# Patient Record
Sex: Male | Born: 1960 | Race: White | Hispanic: No | Marital: Married | Smoking: Never smoker
Health system: Southern US, Community
[De-identification: ages and names within clinical notes are randomized; demographics above are authoritative.]

## PROBLEM LIST (undated history)

## (undated) DIAGNOSIS — R972 Elevated prostate specific antigen [PSA]: Secondary | ICD-10-CM

## (undated) DIAGNOSIS — T7840XA Allergy, unspecified, initial encounter: Secondary | ICD-10-CM

## (undated) DIAGNOSIS — K219 Gastro-esophageal reflux disease without esophagitis: Secondary | ICD-10-CM

## (undated) DIAGNOSIS — I10 Essential (primary) hypertension: Secondary | ICD-10-CM

## (undated) HISTORY — DX: Allergy, unspecified, initial encounter: T78.40XA

## (undated) HISTORY — DX: Gastro-esophageal reflux disease without esophagitis: K21.9

## (undated) HISTORY — PX: CHOLECYSTECTOMY: SHX55

## (undated) HISTORY — DX: Essential (primary) hypertension: I10

## (undated) HISTORY — PX: TONSILLECTOMY: SUR1361

## (undated) HISTORY — DX: Elevated prostate specific antigen (PSA): R97.20

---

## 2013-02-06 ENCOUNTER — Ambulatory Visit: Payer: Self-pay | Admitting: General Practice

## 2014-02-03 ENCOUNTER — Ambulatory Visit: Payer: Self-pay | Admitting: General Practice

## 2014-04-03 HISTORY — PX: NECK SURGERY: SHX720

## 2014-05-19 DIAGNOSIS — M4802 Spinal stenosis, cervical region: Secondary | ICD-10-CM | POA: Insufficient documentation

## 2015-02-08 ENCOUNTER — Other Ambulatory Visit: Payer: Self-pay | Admitting: Physician Assistant

## 2015-02-11 ENCOUNTER — Other Ambulatory Visit: Payer: Self-pay | Admitting: Emergency Medicine

## 2015-02-11 DIAGNOSIS — I10 Essential (primary) hypertension: Secondary | ICD-10-CM

## 2015-02-11 MED ORDER — LISINOPRIL 20 MG PO TABS: 20.0000 mg | ORAL_TABLET | Freq: Every day | ORAL | Status: DC

## 2015-02-11 NOTE — Telephone Encounter (Signed)
Received a faxed medication request from [facility] in [place].  Please advise.  Thank you. 

## 2015-02-11 NOTE — Telephone Encounter (Signed)
Med refill approved 

## 2015-03-17 ENCOUNTER — Encounter: Payer: Self-pay | Admitting: Physician Assistant

## 2015-03-17 ENCOUNTER — Ambulatory Visit: Payer: Self-pay | Admitting: Physician Assistant

## 2015-03-17 VITALS — BP 140/90 | HR 96 | Temp 97.9°F

## 2015-03-17 DIAGNOSIS — J209 Acute bronchitis, unspecified: Secondary | ICD-10-CM

## 2015-03-17 MED ORDER — METHYLPREDNISOLONE 4 MG PO TBPK: ORAL_TABLET | ORAL | Status: DC

## 2015-03-17 MED ORDER — ALBUTEROL SULFATE HFA 108 (90 BASE) MCG/ACT IN AERS: 2.0000 | INHALATION_SPRAY | Freq: Four times a day (QID) | RESPIRATORY_TRACT | Status: DC | PRN

## 2015-03-17 MED ORDER — IPRATROPIUM-ALBUTEROL 0.5-2.5 (3) MG/3ML IN SOLN
3.0000 mL | Freq: Once | RESPIRATORY_TRACT | Status: AC
  Administered 2015-03-17: 3 mL via RESPIRATORY_TRACT

## 2015-03-17 MED ORDER — AZITHROMYCIN 250 MG PO TABS: ORAL_TABLET | ORAL | Status: DC

## 2015-03-17 NOTE — Progress Notes (Signed)
S: C/o cough and congestion with wheezing and chest tightness, chest is sore from coughing, denies fever, chills, mucus is green,  cough is dry and hacking; keeping pt awake at night;  Also has sneezing and a runny nose; denies cardiac type chest pain or sob, v/d, abd pain Remainder ros neg  O: vitals wnl, nad, tms clear, throat injected, neck supple no lymph, lungs with wheezing, cv rrr, neuro intact, svn duoneb given, wheezing decreased pt states he feels much better  A:  Acute bronchitis   P:  rx medication:  Zpack , medrol dose pack, albuterol inhaler;  use otc meds, tylenol or motrin as needed for fever/chills, return if not better in 3 -5 days, return earlier if worsening

## 2015-04-12 ENCOUNTER — Other Ambulatory Visit: Payer: Self-pay | Admitting: Physician Assistant

## 2015-04-14 MED ORDER — IPRATROPIUM-ALBUTEROL 0.5-2.5 (3) MG/3ML IN SOLN
3.0000 mL | Freq: Four times a day (QID) | RESPIRATORY_TRACT | Status: DC
  Administered 2015-04-14: 3 mL via RESPIRATORY_TRACT

## 2015-04-14 NOTE — Addendum Note (Signed)
Addended by: Catha BrowEACON, Tayshaun Kroh T on: 04/14/2015 02:03 PM   Modules accepted: Orders

## 2015-04-15 ENCOUNTER — Telehealth: Payer: Self-pay | Admitting: Emergency Medicine

## 2015-04-15 NOTE — Telephone Encounter (Signed)
Patient's wife called and expressed that her husband has finished the Zpack and continues to cough up green mucus.  Per Susan's authorization I [facility] [place] [facility] [place]

## 2015-04-20 ENCOUNTER — Other Ambulatory Visit: Payer: Self-pay | Admitting: Emergency Medicine

## 2015-04-20 DIAGNOSIS — J209 Acute bronchitis, unspecified: Secondary | ICD-10-CM

## 2015-04-20 NOTE — Telephone Encounter (Signed)
Received a faxed medication request from [facility] [place].  Please advise.  Thank you. 

## 2015-04-21 MED ORDER — HYDROCHLOROTHIAZIDE 25 MG PO TABS: 25.0000 mg | ORAL_TABLET | Freq: Every day | ORAL | Status: DC

## 2015-04-21 NOTE — Telephone Encounter (Signed)
Med refill approved 

## 2015-10-13 ENCOUNTER — Other Ambulatory Visit: Payer: Self-pay | Admitting: Physician Assistant

## 2015-10-13 NOTE — Telephone Encounter (Signed)
Needs fasting labs in clinic  .

## 2015-11-09 ENCOUNTER — Other Ambulatory Visit: Payer: Self-pay | Admitting: Physician Assistant

## 2015-11-09 DIAGNOSIS — I10 Essential (primary) hypertension: Secondary | ICD-10-CM

## 2015-12-02 ENCOUNTER — Other Ambulatory Visit: Payer: Self-pay | Admitting: Emergency Medicine

## 2015-12-02 MED ORDER — HYDROCHLOROTHIAZIDE 25 MG PO TABS: ORAL_TABLET | ORAL | 6 refills | Status: DC

## 2015-12-02 NOTE — Telephone Encounter (Signed)
Med refill approved 

## 2015-12-08 ENCOUNTER — Other Ambulatory Visit: Payer: Self-pay | Admitting: Physician Assistant

## 2015-12-08 DIAGNOSIS — I10 Essential (primary) hypertension: Secondary | ICD-10-CM

## 2016-01-02 ENCOUNTER — Other Ambulatory Visit: Payer: Self-pay | Admitting: Physician Assistant

## 2016-01-02 DIAGNOSIS — I10 Essential (primary) hypertension: Secondary | ICD-10-CM

## 2016-01-03 NOTE — Telephone Encounter (Signed)
Med refill approved 

## 2016-02-02 ENCOUNTER — Other Ambulatory Visit: Payer: Self-pay

## 2016-02-02 DIAGNOSIS — Z299 Encounter for prophylactic measures, unspecified: Secondary | ICD-10-CM

## 2016-02-02 NOTE — Progress Notes (Signed)
Patient came in to get blood drawn for testing per Susan's authorization.

## 2016-02-03 LAB — CMP12+LP+TP+TSH+6AC+PSA+CBC…
ALBUMIN: 4.1 g/dL (ref 3.5–5.5)
ALK PHOS: 62 IU/L (ref 39–117)
ALT: 25 IU/L (ref 0–44)
AST: 17 IU/L (ref 0–40)
Albumin/Globulin Ratio: 1.7 (ref 1.2–2.2)
BASOS ABS: 0 10*3/uL (ref 0.0–0.2)
BILIRUBIN TOTAL: 0.8 mg/dL (ref 0.0–1.2)
BUN/Creatinine Ratio: 14 (ref 9–20)
BUN: 16 mg/dL (ref 6–24)
Basos: 0 %
CALCIUM: 9.5 mg/dL (ref 8.7–10.2)
CHLORIDE: 102 mmol/L (ref 96–106)
CHOLESTEROL TOTAL: 202 mg/dL — AB (ref 100–199)
Chol/HDL Ratio: 3.2 ratio units (ref 0.0–5.0)
Creatinine, Ser: 1.15 mg/dL (ref 0.76–1.27)
EOS (ABSOLUTE): 0.1 10*3/uL (ref 0.0–0.4)
EOS: 1 %
FREE THYROXINE INDEX: 2.4 (ref 1.2–4.9)
GFR calc Af Amer: 83 mL/min/{1.73_m2} (ref >59)
GFR calc non Af Amer: 72 mL/min/{1.73_m2} (ref >59)
GGT: 53 IU/L (ref 0–65)
GLOBULIN, TOTAL: 2.4 g/dL (ref 1.5–4.5)
GLUCOSE: 133 mg/dL — AB (ref 65–99)
HDL: 63 mg/dL (ref >39)
HEMOGLOBIN: 14.1 g/dL (ref 12.6–17.7)
Hematocrit: 42.1 % (ref 37.5–51.0)
IMMATURE GRANULOCYTES: 0 %
IRON: 95 ug/dL (ref 38–169)
Immature Grans (Abs): 0 10*3/uL (ref 0.0–0.1)
LDH: 220 IU/L (ref 121–224)
LDL CALC: 123 mg/dL — AB (ref 0–99)
LYMPHS ABS: 2 10*3/uL (ref 0.7–3.1)
Lymphs: 26 %
MCH: 30.9 pg (ref 26.6–33.0)
MCHC: 33.5 g/dL (ref 31.5–35.7)
MCV: 92 fL (ref 79–97)
MONOS ABS: 0.6 10*3/uL (ref 0.1–0.9)
Monocytes: 8 %
NEUTROS ABS: 4.8 10*3/uL (ref 1.4–7.0)
NEUTROS PCT: 65 %
PLATELETS: 209 10*3/uL (ref 150–379)
PROSTATE SPECIFIC AG, SERUM: 14.6 ng/mL — AB (ref 0.0–4.0)
Phosphorus: 3.5 mg/dL (ref 2.5–4.5)
Potassium: 4.3 mmol/L (ref 3.5–5.2)
RBC: 4.57 x10E6/uL (ref 4.14–5.80)
RDW: 14.1 % (ref 12.3–15.4)
Sodium: 147 mmol/L — ABNORMAL HIGH (ref 134–144)
T3 UPTAKE RATIO: 26 % (ref 24–39)
T4, Total: 9.1 ug/dL (ref 4.5–12.0)
TOTAL PROTEIN: 6.5 g/dL (ref 6.0–8.5)
TRIGLYCERIDES: 81 mg/dL (ref 0–149)
TSH: 1.56 u[IU]/mL (ref 0.450–4.500)
Uric Acid: 8.3 mg/dL (ref 3.7–8.6)
VLDL CHOLESTEROL CAL: 16 mg/dL (ref 5–40)
WBC: 7.5 10*3/uL (ref 3.4–10.8)

## 2016-02-03 LAB — VITAMIN D 25 HYDROXY (VIT D DEFICIENCY, FRACTURES): Vit D, 25-Hydroxy: 26.4 ng/mL — ABNORMAL LOW (ref 30.0–100.0)

## 2016-02-03 LAB — HGB A1C W/O EAG: HEMOGLOBIN A1C: 6.6 % — AB (ref 4.8–5.6)

## 2016-06-01 ENCOUNTER — Ambulatory Visit: Payer: Self-pay | Admitting: Physician Assistant

## 2016-06-01 ENCOUNTER — Encounter: Payer: Self-pay | Admitting: Physician Assistant

## 2016-06-01 VITALS — BP 149/80 | HR 87 | Temp 98.1°F

## 2016-06-01 DIAGNOSIS — J208 Acute bronchitis due to other specified organisms: Secondary | ICD-10-CM

## 2016-06-01 MED ORDER — PSEUDOEPH-BROMPHEN-DM 30-2-10 MG/5ML PO SYRP
5.0000 mL | ORAL_SOLUTION | Freq: Four times a day (QID) | ORAL | 0 refills | Status: DC | PRN
Start: 2016-06-01 — End: 2016-08-02

## 2016-06-01 MED ORDER — ALBUTEROL SULFATE HFA 108 (90 BASE) MCG/ACT IN AERS: 2.0000 | INHALATION_SPRAY | Freq: Four times a day (QID) | RESPIRATORY_TRACT | 0 refills | Status: DC | PRN

## 2016-06-01 MED ORDER — SULFAMETHOXAZOLE-TRIMETHOPRIM 800-160 MG PO TABS: 1.0000 | ORAL_TABLET | Freq: Two times a day (BID) | ORAL | 0 refills | Status: DC

## 2016-06-01 NOTE — Progress Notes (Signed)
   Subjective:URI    Patient ID: Calvin Horton, male    DOB: Oct 14, 1960, 56 y.o.   MRN: [id number]  HPI Patient c/o 2 week of intermitting productive/non-productive cough. Denies fever/chill, or N/V/D. Also have wheezing post coughing spell. Wife is also sick .    Review of Systems    HTN Objective:   Physical Exam HEENT unremarkable. Neck supple w/o adenopathy. Lungs with bilateral Rales. Heart RRR.       Assessment & Plan:Bronchitis  Bactrim DS, Bromfed DM, and Proventil.  Follow up with Family doctor in e-5 days if no improvement.

## 2016-06-27 ENCOUNTER — Other Ambulatory Visit: Payer: Self-pay | Admitting: Physician Assistant

## 2016-06-27 NOTE — Telephone Encounter (Signed)
Med refill for hctz approved.  Changed to 90 days supply with 3 refills

## 2016-08-02 ENCOUNTER — Ambulatory Visit: Payer: Self-pay | Admitting: Physician Assistant

## 2016-08-02 ENCOUNTER — Encounter: Payer: Self-pay | Admitting: Physician Assistant

## 2016-08-02 VITALS — BP 119/79 | HR 70 | Temp 98.3°F | Resp 16 | Ht 68.0 in | Wt 240.0 lb

## 2016-08-02 DIAGNOSIS — Z0000 Encounter for general adult medical examination without abnormal findings: Secondary | ICD-10-CM

## 2016-08-02 DIAGNOSIS — K219 Gastro-esophageal reflux disease without esophagitis: Secondary | ICD-10-CM | POA: Insufficient documentation

## 2016-08-02 NOTE — Progress Notes (Signed)
S: pt here for wellness physical,  had biometrics for insurance purposes done at work, states his glucose and cholesterol were elevated, is seeing urologist for prostate problems, no complaints ros neg. PMH:  htn, elevated psa  Social: nonsmoker, married Fam: dm, dgd, prostate CA, is an only child  O: vitals wnl, nad, ENT wnl, neck supple no lymph, lungs c t a, cv rrr, abd soft nontender bs normal all 4 quads  A: wellness,   P: fasting labs today

## 2016-08-08 LAB — CMP12+LP+TP+TSH+6AC+PSA+CBC…
A/G RATIO: 2 (ref 1.2–2.2)
ALK PHOS: 59 IU/L (ref 39–117)
ALT: 25 IU/L (ref 0–44)
AST: 25 IU/L (ref 0–40)
Albumin: 4.4 g/dL (ref 3.5–5.5)
BASOS: 0 %
BILIRUBIN TOTAL: 0.8 mg/dL (ref 0.0–1.2)
BUN / CREAT RATIO: 15 (ref 9–20)
BUN: 15 mg/dL (ref 6–24)
Basophils Absolute: 0 10*3/uL (ref 0.0–0.2)
CHOL/HDL RATIO: 2.9 ratio (ref 0.0–5.0)
CREATININE: 1.03 mg/dL (ref 0.76–1.27)
Calcium: 9.8 mg/dL (ref 8.7–10.2)
Chloride: 99 mmol/L (ref 96–106)
Cholesterol, Total: 200 mg/dL — ABNORMAL HIGH (ref 100–199)
EOS (ABSOLUTE): 0.1 10*3/uL (ref 0.0–0.4)
EOS: 1 %
Free Thyroxine Index: 2.6 (ref 1.2–4.9)
GFR calc Af Amer: 95 mL/min/{1.73_m2} (ref >59)
GFR, EST NON AFRICAN AMERICAN: 82 mL/min/{1.73_m2} (ref >59)
GGT: 53 IU/L (ref 0–65)
GLUCOSE: 91 mg/dL (ref 65–99)
Globulin, Total: 2.2 g/dL (ref 1.5–4.5)
HDL: 69 mg/dL (ref >39)
HEMATOCRIT: 42.3 % (ref 37.5–51.0)
HEMOGLOBIN: 14.8 g/dL (ref 13.0–17.7)
IMMATURE GRANS (ABS): 0 10*3/uL (ref 0.0–0.1)
IMMATURE GRANULOCYTES: 0 %
Iron: 94 ug/dL (ref 38–169)
LDH: 255 IU/L — AB (ref 121–224)
LDL CALC: 112 mg/dL — AB (ref 0–99)
LYMPHS ABS: 2.2 10*3/uL (ref 0.7–3.1)
Lymphs: 26 %
MCH: 31.4 pg (ref 26.6–33.0)
MCHC: 35 g/dL (ref 31.5–35.7)
MCV: 90 fL (ref 79–97)
Monocytes Absolute: 0.5 10*3/uL (ref 0.1–0.9)
Monocytes: 5 %
NEUTROS PCT: 68 %
Neutrophils Absolute: 5.9 10*3/uL (ref 1.4–7.0)
POTASSIUM: 3.5 mmol/L (ref 3.5–5.2)
PROSTATE SPECIFIC AG, SERUM: 8.7 ng/mL — AB (ref 0.0–4.0)
Phosphorus: 2.8 mg/dL (ref 2.5–4.5)
Platelets: 214 10*3/uL (ref 150–379)
RBC: 4.72 x10E6/uL (ref 4.14–5.80)
RDW: 13.9 % (ref 12.3–15.4)
SODIUM: 145 mmol/L — AB (ref 134–144)
T3 Uptake Ratio: 27 % (ref 24–39)
T4 TOTAL: 9.5 ug/dL (ref 4.5–12.0)
TSH: 1.19 u[IU]/mL (ref 0.450–4.500)
Total Protein: 6.6 g/dL (ref 6.0–8.5)
Triglycerides: 95 mg/dL (ref 0–149)
URIC ACID: 9 mg/dL — AB (ref 3.7–8.6)
VLDL Cholesterol Cal: 19 mg/dL (ref 5–40)
WBC: 8.7 10*3/uL (ref 3.4–10.8)

## 2016-08-08 LAB — HCV COMMENT:

## 2016-08-08 LAB — HIV ANTIBODY (ROUTINE TESTING W REFLEX): HIV Screen 4th Generation wRfx: NONREACTIVE

## 2016-08-08 LAB — VITAMIN D 25 HYDROXY (VIT D DEFICIENCY, FRACTURES): Vit D, 25-Hydroxy: 23.7 ng/mL — ABNORMAL LOW (ref 30.0–100.0)

## 2016-08-08 LAB — HEPATITIS C ANTIBODY (REFLEX): HCV AB: 0.1 {s_co_ratio} (ref 0.0–0.9)

## 2016-08-12 ENCOUNTER — Encounter: Payer: Self-pay | Admitting: Physician Assistant

## 2017-04-03 LAB — HM COLONOSCOPY

## 2017-04-18 ENCOUNTER — Ambulatory Visit: Payer: Self-pay | Admitting: Registered Nurse

## 2017-04-18 VITALS — BP 129/80 | HR 90 | Temp 98.5°F | Resp 16

## 2017-04-18 DIAGNOSIS — J0101 Acute recurrent maxillary sinusitis: Secondary | ICD-10-CM

## 2017-04-18 DIAGNOSIS — J209 Acute bronchitis, unspecified: Secondary | ICD-10-CM

## 2017-04-18 MED ORDER — SALINE SPRAY 0.65 % NA SOLN: 2.0000 | NASAL | 0 refills | Status: DC

## 2017-04-18 MED ORDER — ALBUTEROL SULFATE HFA 108 (90 BASE) MCG/ACT IN AERS: 1.0000 | INHALATION_SPRAY | RESPIRATORY_TRACT | 0 refills | Status: DC | PRN

## 2017-04-18 MED ORDER — PREDNISONE 20 MG PO TABS: 40.0000 mg | ORAL_TABLET | Freq: Every day | ORAL | 0 refills | Status: DC

## 2017-04-18 MED ORDER — AZITHROMYCIN 250 MG PO TABS: ORAL_TABLET | ORAL | 0 refills | Status: DC

## 2017-04-18 NOTE — Progress Notes (Signed)
Subjective:    Patient ID: Calvin Horton, male    DOB: [birth year], 57 y.o.   MRN: [id number]  56y/o caucasian male established patient here for nonproductive cough, hoarse/sore throat, congestion nasal , sinus pressure, headache, chest congestion and wheezing.  Ran out of albuterol from previous illness last year.  Typically once a year needs inhaler, steroids and antibiotic.  Sick contacts at work. Takes OTC antihistamine year round for allergies.  Tessalon pearles don't work for him.  Thinks the zpak from [facility] worked better than bactrim DS [facility]azing gave him last year.      Review of Systems  Constitutional: Positive for fatigue. Negative for activity change, appetite change, chills, diaphoresis, fever and unexpected weight change.  HENT: Positive for congestion, postnasal drip, rhinorrhea, sinus pressure, sinus pain and sore throat. Negative for dental problem, drooling, ear discharge, ear pain, facial swelling, hearing loss, mouth sores, nosebleeds, sneezing, tinnitus, trouble swallowing and voice change.   Eyes: Negative for photophobia, pain, discharge, redness, itching and visual disturbance.  Respiratory: Positive for cough and chest tightness. Negative for choking, shortness of breath, wheezing and stridor.   Cardiovascular: Negative for chest pain, palpitations and leg swelling.  Gastrointestinal: Negative for abdominal distention, abdominal pain, blood in stool, constipation, diarrhea, nausea and vomiting.  Endocrine: Negative for cold intolerance and heat intolerance.  Genitourinary: Negative for dysuria.  Musculoskeletal: Negative for arthralgias, back pain, gait problem, joint swelling, myalgias, neck pain and neck stiffness.  Skin: Negative for color change, pallor, rash and wound.  Allergic/Immunologic: Positive for environmental allergies. Negative for food allergies and immunocompromised state.  Neurological: Positive for headaches. Negative for dizziness,  tremors, seizures, syncope, facial asymmetry, speech difficulty, weakness, light-headedness and numbness.  Hematological: Negative for adenopathy. Does not bruise/bleed easily.  Psychiatric/Behavioral: Negative for agitation, behavioral problems, confusion and sleep disturbance.       Objective:   Physical Exam  Constitutional: He is oriented to person, place, and time. Vital signs are normal. He appears well-developed and well-nourished. He is active and cooperative.  Non-toxic appearance. He does not have a sickly appearance. He appears ill. No distress.  HENT:  Head: Normocephalic and atraumatic.  Right Ear: Hearing, external ear and ear canal normal. A middle ear effusion is present.  Left Ear: Hearing, external ear and ear canal normal. A middle ear effusion is present.  Nose: Mucosal edema and rhinorrhea present. No nose lacerations, sinus tenderness, nasal deformity, septal deviation or nasal septal hematoma. No epistaxis.  No foreign bodies. Right sinus exhibits maxillary sinus tenderness and frontal sinus tenderness. Left sinus exhibits maxillary sinus tenderness and frontal sinus tenderness.  Mouth/Throat: Uvula is midline. Mucous membranes are not pale, dry and not cyanotic. He does not have dentures. No oral lesions. No trismus in the jaw. Normal dentition. No dental abscesses, uvula swelling, lacerations or dental caries. Posterior oropharyngeal edema and posterior oropharyngeal erythema present. No oropharyngeal exudate or tonsillar abscesses.  Maxillary greater than frontal TTP bilaterally; bilateral TMs air fluid level clear; cobblestoning posterior pharynx;  Tongue with brown and white coating centrally dry mucous membranes; hoarse voice; bilateral allergic shiners; bilateral nasal turbinates with edema/erythema clear discharge  Eyes: Conjunctivae, EOM and lids are normal. Pupils are equal, round, and reactive to light. Right eye exhibits no chemosis, no discharge, no exudate and no  hordeolum. No foreign body present in the right eye. Left eye exhibits no chemosis, no discharge, no exudate and no hordeolum. No foreign body present in the left eye. Right  conjunctiva is not injected. Right conjunctiva has no hemorrhage. Left conjunctiva is not injected. Left conjunctiva has no hemorrhage. No scleral icterus. Right eye exhibits normal extraocular motion and no nystagmus. Left eye exhibits normal extraocular motion and no nystagmus. Right pupil is round and reactive. Left pupil is round and reactive. Pupils are equal.  Neck: Trachea normal, normal range of motion and phonation normal. Neck supple. No tracheal tenderness and no muscular tenderness present. No neck rigidity. No tracheal deviation, no edema, no erythema and normal range of motion present. No thyroid mass and no thyromegaly present.  Cardiovascular: Normal rate, regular rhythm, S1 normal, S2 normal, normal heart sounds and intact distal pulses. PMI is not displaced. Exam reveals no gallop and no friction rub.  No murmur heard. Pulmonary/Chest: Effort normal and breath sounds normal. No accessory muscle usage or stridor. No respiratory distress. He has no decreased breath sounds. He has no wheezes. He has no rhonchi. He has no rales.  No cough observed in exam room; spoke full sentences without difficulty  Abdominal: Soft. Normal appearance. He exhibits no distension, no fluid wave and no ascites. There is no rigidity and no guarding.  Musculoskeletal: Normal range of motion. He exhibits no edema or tenderness.       Right shoulder: Normal.       Left shoulder: Normal.       Right elbow: Normal.      Left elbow: Normal.       Right hip: Normal.       Left hip: Normal.       Right knee: Normal.       Left knee: Normal.       Cervical back: Normal.       Thoracic back: Normal.       Lumbar back: Normal.       Right hand: Normal.       Left hand: Normal.  Lymphadenopathy:       Head (right side): No submental, no  submandibular, no tonsillar, no preauricular, no posterior auricular and no occipital adenopathy present.       Head (left side): No submental, no submandibular, no tonsillar, no preauricular, no posterior auricular and no occipital adenopathy present.    He has no cervical adenopathy.       Right cervical: No superficial cervical, no deep cervical and no posterior cervical adenopathy present.      Left cervical: No superficial cervical, no deep cervical and no posterior cervical adenopathy present.  Neurological: He is alert and oriented to person, place, and time. He has normal strength. He is not disoriented. He displays no atrophy and no tremor. No cranial nerve deficit or sensory deficit. He exhibits normal muscle tone. He displays no seizure activity. Coordination and gait normal. GCS eye subscore is 4. GCS verbal subscore is 5. GCS motor subscore is 6.  On/off exam table without difficulty; gait sure and steady in hallway  Skin: Skin is warm, dry and intact. No abrasion, no bruising, no burn, no ecchymosis, no laceration, no lesion, no petechiae and no rash noted. He is not diaphoretic. No cyanosis or erythema. No pallor. Nails show no clubbing.  Psychiatric: He has a normal mood and affect. His speech is normal and behavior is normal. Judgment and thought content normal. Cognition and memory are normal.  Nursing note and vitals reviewed.         Assessment & Plan:  A-acute bronchitis and recurrent acute maxillary sinusitis  P-start saline 2 sprays  each nostril q2h wa prn congestion.  Continue OTC antihistamine po daily e.g. Zyrtec 10mg  po daily. Consider flonase 1 spray each nostril BID OTC.  Hydrate hydrate hydrate to keep urine pale clear yellow tinged as mucous membranes dry. If no improvement with 48 hours of saline and prednisone use start azithromycin 500mg  po day 1 then 250mg  po daily days 2-5 #6 RF0  Electronic Rx given.  Denied personal or family history of ENT cancer.  Shower BID  especially prior to bed. No evidence of systemic bacterial infection, non toxic and well hydrated.  I do not see where any further testing or imaging is necessary at this time.   I will suggest supportive care, rest, good hygiene and encourage the patient to take adequate fluids.  The patient is to return to clinic or EMERGENCY ROOM if symptoms worsen or change significantly.  Exitcare handout on sinusitis and sinus rinse given to patient.  Patient verbalized agreement and understanding of treatment plan and had no further questions at this time.   P2:  Hand washing and cover cough  Rx Prednisone 40mg  po daily with breakfast x 3 days #8 RF0 electronic to his pharmacy of choice.  Discussed possible side effects increased/decreased appetite, difficulty sleeping, increased blood sugar, increased blood pressure and heart rate.  Albuterol MDI 1-2 puffs po q4-6h prn protracted cough/wheeze #1 RF0 side effect increased heart rate. Cough lozenges po q2h prn cough OTC.  Bronchitis simple, community acquired, may have started as viral (probably respiratory syncytial, parainfluenza, influenza, or adenovirus), but now evidence of acute purulent bronchitis with resultant bronchial edema and mucus formation.  Viruses are the most common cause of bronchial inflammation in otherwise healthy adults with acute bronchitis.  The appearance of sputum is not predictive of whether a bacterial infection is present.  Purulent sputum is most often caused by viral infections.  There are a small portion of those caused by non-viral agents being Mycoplama pneumonia.  Microscopic examination or C&S of sputum in the healthy adult with acute bronchitis is generally not helpful (usually negative or normal respiratory flora) other considerations being cough from upper respiratory tract infections, sinusitis or allergic syndromes (mild asthma or viral pneumonia).  Differential Diagnoses:  reactive airway disease (asthma, allergic  aspergillosis (eosinophilia), chronic bronchitis, respiratory infection (sinusitis, common cold, pneumonia), congestive heart failure, reflux esophagitis, bronchogenic tumor, aspiration syndromes and/or exposure to pulmonary irritants/smoke. Without high fever, severe dyspnea, lack of physical findings or other risk factors, I will hold on a chest radiograph and CBC at this time.  I discussed that approximately 50% of patients with acute bronchitis have a cough that lasts up to three weeks, and 25% for over a month.  Tylenol 500mg  one to two tablets every four to six hours as needed for fever or myalgias.  No aspirin. Exitcare handout on bronchitis and inhaler use given to patient.  ER if hemopthysis, SOB, worst chest pain of life.   Patient instructed to follow up in one week or sooner if symptoms worsen.  Patient verbalized agreement and understanding of treatment plan.  P2:  hand washing and cover cough

## 2017-04-18 NOTE — Patient Instructions (Addendum)
Acute Bronchitis, Adult Acute bronchitis is sudden (acute) swelling of the air tubes (bronchi) in the lungs. Acute bronchitis causes these tubes to fill with mucus, which can make it hard to breathe. It can also cause coughing or wheezing. In adults, acute bronchitis usually goes away within 2 weeks. A cough caused by bronchitis may last up to 3 weeks. Smoking, allergies, and asthma can make the condition worse. Repeated episodes of bronchitis may cause further lung problems, such as chronic obstructive pulmonary disease (COPD). What are the causes? This condition can be caused by germs and by substances that irritate the lungs, including:  Cold and flu viruses. This condition is most often caused by the same virus that causes a cold.  Bacteria.  Exposure to tobacco smoke, dust, fumes, and air pollution.  What increases the risk? This condition is more likely to develop in people who:  Have close contact with someone with acute bronchitis.  Are exposed to lung irritants, such as tobacco smoke, dust, fumes, and vapors.  Have a weak immune system.  Have a respiratory condition such as asthma.  What are the signs or symptoms? Symptoms of this condition include:  A cough.  Coughing up clear, yellow, or green mucus.  Wheezing.  Chest congestion.  Shortness of breath.  A fever.  Body aches.  Chills.  A sore throat.  How is this diagnosed? This condition is usually diagnosed with a physical exam. During the exam, your health care provider may order tests, such as chest X-rays, to rule out other conditions. He or she may also:  Test a sample of your mucus for bacterial infection.  Check the level of oxygen in your blood. This is done to check for pneumonia.  Do a chest X-ray or lung function testing to rule out pneumonia and other conditions.  Perform blood tests.  Your health care provider will also ask about your symptoms and medical history. How is this  treated? Most cases of acute bronchitis clear up over time without treatment. Your health care provider may recommend:  Drinking more fluids. Drinking more makes your mucus thinner, which may make it easier to breathe.  Taking a medicine for a fever or cough.  Taking an antibiotic medicine.  Using an inhaler to help improve shortness of breath and to control a cough.  Using a cool mist vaporizer or humidifier to make it easier to breathe.  Follow these instructions at home: Medicines  Take over-the-counter and prescription medicines only as told by your health care provider.  If you were prescribed an antibiotic, take it as told by your health care provider. Do not stop taking the antibiotic even if you start to feel better. General instructions  Get plenty of rest.  Drink enough fluids to keep your urine clear or pale yellow.  Avoid smoking and secondhand smoke. Exposure to cigarette smoke or irritating chemicals will make bronchitis worse. If you smoke and you need help quitting, ask your health care provider. Quitting smoking will help your lungs heal faster.  Use an inhaler, cool mist vaporizer, or humidifier as told by your health care provider.  Keep all follow-up visits as told by your health care provider. This is important. How is this prevented? To lower your risk of getting this condition again:  Wash your hands often with soap and water. If soap and water are not available, use hand sanitizer.  Avoid contact with people who have cold symptoms.  Try not to touch your hands to your   mouth, nose, or eyes.  Make sure to get the flu shot every year.  Contact a health care provider if:  Your symptoms do not improve in 2 weeks of treatment. Get help right away if:  You cough up blood.  You have chest pain.  You have severe shortness of breath.  You become dehydrated.  You faint or keep feeling like you are going to faint.  You keep vomiting.  You have a  severe headache.  Your fever or chills gets worse. This information is not intended to replace advice given to you by your health care provider. Make sure you discuss any questions you have with your health care provider. Document Released: 04/27/2004 Document Revised: 10/13/2015 Document Reviewed: 09/08/2015 Elsevier Interactive Patient Education  2018 [employer] Sinusitis, Adult Sinusitis is soreness and inflammation of your sinuses. Sinuses are hollow spaces in the bones around your face. Your sinuses are located:  Around your eyes.  In the middle of your forehead.  Behind your nose.  In your cheekbones.  Your sinuses and nasal passages are lined with a stringy fluid (mucus). Mucus normally drains out of your sinuses. When your nasal tissues become inflamed or swollen, the mucus can become trapped or blocked so air cannot flow through your sinuses. This allows bacteria, viruses, and funguses to grow, which leads to infection. Sinusitis can develop quickly and last for 7?10 days (acute) or for more than 12 weeks (chronic). Sinusitis often develops after a cold. What are the causes? This condition is caused by anything that creates swelling in the sinuses or stops mucus from draining, including:  Allergies.  Asthma.  Bacterial or viral infection.  Abnormally shaped bones between the nasal passages.  Nasal growths that contain mucus (nasal polyps).  Narrow sinus openings.  Pollutants, such as chemicals or irritants in the air.  A foreign object stuck in the nose.  A fungal infection. This is rare.  What increases the risk? The following factors may make you more likely to develop this condition:  Having allergies or asthma.  Having had a recent cold or respiratory tract infection.  Having structural deformities or blockages in your nose or sinuses.  Having a weak immune system.  Doing a lot of swimming or diving.  Overusing nasal sprays.  Smoking.  What  are the signs or symptoms? The main symptoms of this condition are pain and a feeling of pressure around the affected sinuses. Other symptoms include:  Upper toothache.  Earache.  Headache.  Bad breath.  Decreased sense of smell and taste.  A cough that may get worse at night.  Fatigue.  Fever.  Thick drainage from your nose. The drainage is often green and it may contain pus (purulent).  Stuffy nose or congestion.  Postnasal drip. This is when extra mucus collects in the throat or back of the nose.  Swelling and warmth over the affected sinuses.  Sore throat.  Sensitivity to light.  How is this diagnosed? This condition is diagnosed based on symptoms, a medical history, and a physical exam. To find out if your condition is acute or chronic, your health care provider may:  Look in your nose for signs of nasal polyps.  Tap over the affected sinus to check for signs of infection.  View the inside of your sinuses using an imaging device that has a light attached (endoscope).  If your health care provider suspects that you have chronic sinusitis, you may also:  Be tested for allergies.  Have   a sample of mucus taken from your nose (nasal culture) and checked for bacteria.  Have a mucus sample examined to see if your sinusitis is related to an allergy.  If your sinusitis does not respond to treatment and it lasts longer than 8 weeks, you may have an MRI or CT scan to check your sinuses. These scans also help to determine how severe your infection is. In rare cases, a bone biopsy may be done to rule out more serious types of fungal sinus disease. How is this treated? Treatment for sinusitis depends on the cause and whether your condition is chronic or acute. If a virus is causing your sinusitis, your symptoms will go away on their own within 10 days. You may be given medicines to relieve your symptoms, including:  Topical nasal decongestants. They shrink swollen nasal  passages and let mucus drain from your sinuses.  Antihistamines. These drugs block inflammation that is triggered by allergies. This can help to ease swelling in your nose and sinuses.  Topical nasal corticosteroids. These are nasal sprays that ease inflammation and swelling in your nose and sinuses.  Nasal saline washes. These rinses can help to get rid of thick mucus in your nose.  If your condition is caused by bacteria, you will be given an antibiotic medicine. If your condition is caused by a fungus, you will be given an antifungal medicine. Surgery may be needed to correct underlying conditions, such as narrow nasal passages. Surgery may also be needed to remove polyps. Follow these instructions at home: Medicines  Take, use, or apply over-the-counter and prescription medicines only as told by your health care provider. These may include nasal sprays.  If you were prescribed an antibiotic medicine, take it as told by your health care provider. Do not stop taking the antibiotic even if you start to feel better. Hydrate and Humidify  Drink enough water to keep your urine clear or pale yellow. Staying hydrated will help to thin your mucus.  Use a cool mist humidifier to keep the humidity level in your home above 50%.  Inhale steam for 10-15 minutes, 3-4 times a day or as told by your health care provider. You can do this in the bathroom while a hot shower is running.  Limit your exposure to cool or dry air. Rest  Rest as much as possible.  Sleep with your head raised (elevated).  Make sure to get enough sleep each night. General instructions  Apply a warm, moist washcloth to your face 3-4 times a day or as told by your health care provider. This will help with discomfort.  Wash your hands often with soap and water to reduce your exposure to viruses and other germs. If soap and water are not available, use hand sanitizer.  Do not smoke. Avoid being around people who are  smoking (secondhand smoke).  Keep all follow-up visits as told by your health care provider. This is important. Contact a health care provider if:  You have a fever.  Your symptoms get worse.  Your symptoms do not improve within 10 days. Get help right away if:  You have a severe headache.  You have persistent vomiting.  You have pain or swelling around your face or eyes.  You have vision problems.  You develop confusion.  Your neck is stiff.  You have trouble breathing. This information is not intended to replace advice given to you by your health care provider. Make sure you discuss any questions you have   with your health care provider. Document Released: 03/20/2005 Document Revised: 11/14/2015 Document Reviewed: 01/13/2015 Elsevier Interactive Patient Education  2018 [employer] Sinus Rinse What is a sinus rinse? A sinus rinse is a simple home treatment that is used to rinse your sinuses with a sterile mixture of salt and water (saline solution). Sinuses are air-filled spaces in your skull behind the bones of your face and forehead that open into your nasal cavity. You will use the following:  Saline solution.  Neti pot or spray bottle. This releases the saline solution into your nose and through your sinuses. Neti pots and spray bottles can be purchased at your local pharmacy, a health food store, or online.  When would I do a sinus rinse? A sinus rinse can help to clear mucus, dirt, dust, or pollen from the nasal cavity. You may do a sinus rinse when you have a cold, a virus, nasal allergy symptoms, a sinus infection, or stuffiness in the nose or sinuses. If you are considering a sinus rinse:  Ask your child's health care provider before performing a sinus rinse on your child.  Do not do a sinus rinse if you have had ear or nasal surgery, ear infection, or blocked ears.  How do I do a sinus rinse?  Wash your hands.  Disinfect your device according to the  directions provided and then dry it.  Use the solution that comes with your device or one that is sold separately in stores. Follow the mixing directions on the package.  Fill your device with the amount of saline solution as directed by the device instructions.  Stand over a sink and tilt your head sideways over the sink.  Place the spout of the device in your upper nostril (the one closer to the ceiling).  Gently pour or squeeze the saline solution into the nasal cavity. The liquid should drain to the lower nostril if you are not overly congested.  Gently blow your nose. Blowing too hard may cause ear pain.  Repeat in the other nostril.  Clean and rinse your device with clean water and then air-dry it. Are there risks of a sinus rinse? Sinus rinse is generally very safe and effective. However, there are a few risks, which include:  A burning sensation in the sinuses. This may happen if you do not make the saline solution as directed. Make sure to follow all directions when making the saline solution.  Infection from contaminated water. This is rare, but possible.  Nasal irritation.  This information is not intended to replace advice given to you by your health care provider. Make sure you discuss any questions you have with your health care provider. Document Released: 10/15/2013 Document Revised: 02/15/2016 Document Reviewed: 08/05/2013 Elsevier Interactive Patient Education  2017 [employer]  How to Use a Metered Dose Inhaler A metered dose inhaler is a handheld device for taking medicine that must be breathed into the lungs (inhaled). The device can be used to deliver a variety of inhaled medicines, including:  Quick relief or rescue medicines, such as bronchodilators.  Controller medicines, such as corticosteroids.  The medicine is delivered by pushing down on a metal canister to release a preset amount of spray and medicine. Each device contains the amount of medicine  that is needed for a preset number of uses (inhalations). Your health care provider may recommend that you use a spacer with your inhaler to help you take the medicine more effectively. A spacer is a plastic tube   with a mouthpiece on one end and an opening that connects to the inhaler on the other end. A spacer holds the medicine in a tube for a short time, which allows you to inhale more medicine. What are the risks? If you do not use your inhaler correctly, medicine might not reach your lungs to help you breathe. Inhaler medicine can cause side effects, such as:  Mouth or throat infection.  Cough.  Hoarseness.  Headache.  Nausea and vomiting.  Lung infection (pneumonia) in people who have a lung condition called COPD.  How to use a metered dose inhaler without a spacer 1. Remove the cap from the inhaler. 2. If you are using the inhaler for the first time, shake it for 5 seconds, turn it away from your face, then release 4 puffs into the air. This is called priming. 3. Shake the inhaler for 5 seconds. 4. Position the inhaler so the top of the canister faces up. 5. Put your index finger on the top of the medicine canister. Support the bottom of the inhaler with your thumb. 6. Breathe out normally and as completely as possible, away from the inhaler. 7. Either place the inhaler between your teeth and close your lips tightly around the mouthpiece, or hold the inhaler 1-2 inches (2.5-5 cm) away from your open mouth. Keep your tongue down out of the way. If you are unsure which technique to use, ask your health care provider. 8. Press the canister down with your index finger to release the medicine, then inhale deeply and slowly through your mouth (not your nose) until your lungs are completely filled. Inhaling should take 4-6 seconds. 9. Hold the medicine in your lungs for 5-10 seconds (10 seconds is best). This helps the medicine get into the small airways of your lungs. 10. With your lips  in a tight circle (pursed), breathe out slowly. 11. Repeat steps 3-10 until you have taken the number of puffs that your health care provider directed. Wait about 1 minute between puffs or as directed. 12. Put the cap on the inhaler. 13. If you are using a steroid inhaler, rinse your mouth with water, gargle, and spit out the water. Do not swallow the water. How to use a metered dose inhaler with a spacer 1. Remove the cap from the inhaler. 2. If you are using the inhaler for the first time, shake it for 5 seconds, turn it away from your face, then release 4 puffs into the air. This is called priming. 3. Shake the inhaler for 5 seconds. 4. Place the open end of the spacer onto the inhaler mouthpiece. 5. Position the inhaler so the top of the canister faces up and the spacer mouthpiece faces you. 6. Put your index finger on the top of the medicine canister. Support the bottom of the inhaler and the spacer with your thumb. 7. Breathe out normally and as completely as possible, away from the spacer. 8. Place the spacer between your teeth and close your lips tightly around it. Keep your tongue down out of the way. 9. Press the canister down with your index finger to release the medicine, then inhale deeply and slowly through your mouth (not your nose) until your lungs are completely filled. Inhaling should take 4-6 seconds. 10. Hold the medicine in your lungs for 5-10 seconds (10 seconds is best). This helps the medicine get into the small airways of your lungs. 11. With your lips in a tight circle (pursed), breathe out slowly.   12. Repeat steps 3-11 until you have taken the number of puffs that your health care provider directed. Wait about 1 minute between puffs or as directed. 13. Remove the spacer from the inhaler and put the cap on the inhaler. 14. If you are using a steroid inhaler, rinse your mouth with water, gargle, and spit out the water. Do not swallow the water. Follow these instructions at  home:  Take your inhaled medicine only as told by your health care provider. Do not use the inhaler more than directed by your health care provider.  Keep all follow-up visits as told by your health care provider. This is important.  If your inhaler has a counter, you can check it to determine how full your inhaler is. If your inhaler does not have a counter, ask your health care provider when you will need to refill your inhaler and write the refill date on a calendar or on your inhaler canister. Note that you cannot know when an inhaler is empty by shaking it.  Follow directions on the package insert for care and cleaning of your inhaler and spacer. Contact a health care provider if:  Symptoms are only partially relieved with your inhaler.  You are having trouble using your inhaler.  You have an increase in phlegm.  You have headaches. Get help right away if:  You feel little or no relief after using your inhaler.  You have dizziness.  You have a fast heart rate.  You have chills or a fever.  You have night sweats.  There is blood in your phlegm. Summary  A metered dose inhaler is a handheld device for taking medicine that must be breathed into the lungs (inhaled).  The medicine is delivered by pushing down on a metal canister to release a preset amount of spray and medicine.  Each device contains the amount of medicine that is needed for a preset number of uses (inhalations). This information is not intended to replace advice given to you by your health care provider. Make sure you discuss any questions you have with your health care provider. Document Released: 03/20/2005 Document Revised: 02/08/2016 Document Reviewed: 02/08/2016 Elsevier Interactive Patient Education  2017 [employer]  

## 2017-05-04 ENCOUNTER — Encounter: Payer: Self-pay | Admitting: Registered Nurse

## 2017-05-04 ENCOUNTER — Telehealth: Payer: Self-pay | Admitting: Registered Nurse

## 2017-05-04 ENCOUNTER — Other Ambulatory Visit: Payer: Self-pay

## 2017-05-04 MED ORDER — HYDROCHLOROTHIAZIDE 25 MG PO TABS: ORAL_TABLET | ORAL | 1 refills | Status: DC

## 2017-05-04 NOTE — Telephone Encounter (Signed)
Electronic Rx sent hydrochlorothiazide 25mg  po daily #90 RF1 for patient.  Annual labs due May 2019.  Ensure patient following up with urology for elevated PSA May 2018.  Will need visit/labs with PCM or clinic staff for next 6 month Rx.

## 2017-06-22 ENCOUNTER — Ambulatory Visit: Payer: Self-pay | Admitting: Family Medicine

## 2017-06-22 VITALS — BP 146/88 | HR 73 | Temp 98.4°F | Resp 18 | Ht 70.0 in | Wt 255.0 lb

## 2017-06-22 DIAGNOSIS — Z008 Encounter for other general examination: Secondary | ICD-10-CM

## 2017-06-22 DIAGNOSIS — Z0189 Encounter for other specified special examinations: Principal | ICD-10-CM

## 2017-06-22 LAB — GLUCOSE, POCT (MANUAL RESULT ENTRY): POC Glucose: 176 mg/dl — AB (ref 70–99)

## 2017-06-22 NOTE — Progress Notes (Signed)
Subjective: Annual biometrics screening  Patient presents for his annual biometric screening. Patient has a history of hypertension and elevated PSA.  He sees urology regularly for his PSA, who is monitoring and treating this.  Patient reports he has an upcoming appointment with his urologist to reassess his PSA.  Patient's blood pressure elevated today at 154/98, patient reports this is related to "white coat syndrome".  After patient had rested in the room for a while his blood pressure lowered to 146/88.  Patient monitors his blood pressure at home and reports that it remains below 140/90.  Patient reports [facility], but that he has not been seen in a while because he has been in good health. Patient works at the [employer]. Patient denies any other issues or concerns.   Review of Systems Constitutional: Unremarkable.  HEENT: Denies dizziness, issues with hearing, vision problems.  Gastrointestinal: Denies issues with bowel or bladder.  Respiratory: Unremarkable.   Cardiovascular: Unremarkable.  ROS otherwise negative.   Objective  Physical Exam General: Awake, alert and oriented. No acute distress. Well developed, hydrated and nourished. Appears stated age.  HEENT:  Supple neck without adenopathy. Sclera is non-icteric. The ear canal is clear without discharge. The tympanic membrane is normal in appearance with normal landmarks and cone of light. Nasal mucosa is pink and moist. Oral mucosa is pink and moist. The pharynx is normal in appearance without tonsillar swelling or exudates.  Skin: Skin in warm, dry and intact without rashes or lesions. Appropriate color for ethnicity. Cardiac: Heart rate and rhythm are normal. No murmurs, gallops, or rubs are auscultated.  Respiratory: The chest wall is symmetric and without deformity. No signs of respiratory distress. Lung sounds are clear in all lobes bilaterally without rales, ronchi, or  wheezes.  Abdominal: Abdomen is soft, symmetric, and non-tender without distention. No masses, hepatomegaly, or splenomegaly are noted.   Neurological: The patient is awake, alert and oriented to person, place, and time with normal speech.  Memory is normal and thought processes intact. No gait abnormalities are appreciated.  Psychiatric: Appropriate mood and affect.   Assessment Annual biometrics screen  Plan  Labs pending. Fasting blood sugar today is 176, patient reports this is truly a fasting value.  Patient denies any history of prediabetes or diabetes but reports this has been elevated in the past.  Is not being treated for this.  Patient reports multiple family members including his mother and both maternal and paternal grandmothers having type 2 diabetes.  Patient educated regarding the implications of prediabetes and diabetes and the importance of following up with his primary care provider within the next month for further evaluation and possible treatment of this.  Informed patient that if he cannot get in to see a primary care provider within the next month to return to the clinic and that I will initiate this but patient believes he should be able to see his previous primary care provider promptly. Encouraged routine visits with primary care provider.  Provided patient with resources to establish care with a primary care provider if he is no longer considered active with his previous primary care providers office.

## 2017-06-23 LAB — LIPID PANEL
CHOLESTEROL TOTAL: 196 mg/dL (ref 100–199)
Chol/HDL Ratio: 3.1 ratio (ref 0.0–5.0)
HDL: 63 mg/dL (ref >39)
LDL Calculated: 113 mg/dL — ABNORMAL HIGH (ref 0–99)
TRIGLYCERIDES: 102 mg/dL (ref 0–149)
VLDL Cholesterol Cal: 20 mg/dL (ref 5–40)

## 2017-06-25 NOTE — Progress Notes (Signed)
Mr. Calvin Horton, I wanted to let you know that your lipid panel came back and looks good with the exception of your LDL cholesterol ("bad cholesterol"), which is elevated at 113.  Normal values are between 0 and 99.  Just wanted to make you aware if this so that you could discuss this with your primary care provider.

## 2017-07-10 ENCOUNTER — Encounter: Payer: Self-pay | Admitting: Urology

## 2017-07-10 ENCOUNTER — Ambulatory Visit: Payer: Managed Care, Other (non HMO) | Admitting: Urology

## 2017-07-10 VITALS — BP 164/99 | HR 114 | Ht 70.0 in | Wt 240.0 lb

## 2017-07-10 DIAGNOSIS — R972 Elevated prostate specific antigen [PSA]: Secondary | ICD-10-CM

## 2017-07-10 DIAGNOSIS — N400 Enlarged prostate without lower urinary tract symptoms: Secondary | ICD-10-CM | POA: Diagnosis not present

## 2017-07-10 NOTE — Progress Notes (Signed)
07/10/2017 5:05 PM   Calvin Horton 25-Nov-1960 [id number]  Referring provider: No referring provider defined for this encounter.  Chief Complaint  Patient presents with  . Elevated PSA    New Patient    HPI: 57 year old male with a history of BPH, elevated PSA who presents today to establish care.  He is previously followed by Dr. Evelene Croon at [facility] urology.  He has a personal history of elevated PSA as high as 16.4 as of 02/2010.  This remained around this range and ultimately underwent prostate biopsy on 1117 with a PSA of 14.6 at that time.  This revealed acute and focal inflammation, otherwise no malignancy.  There was a small focus of atypical glands concerning for carcinoma but not diagnostic for this. TRUS vol at the time 104 g.    Biopsy complicated by pain for several days and scrotal swelling.  He notes that if he has had this in the future, he would like to have this done under anesthesia as it was incredibly uncomfortable.  Subsequently started on finasteride (? Chemoprevention).  Follow-up PSA on 03/15/2017 was 6.0.   TRUS vol 104 g.    He has little to no urinary symptoms.  He denies frequency, urgency, decreased stream, hematuria, dysuria, history of UTIs or bladder stones.  He is overall very pleased with his voiding symptoms.  He does have a family history of prostate cancer in his father.  His father recently died of CHF unrelated to prostate cancer.   PMH: Past Medical History:  Diagnosis Date  . Elevated PSA   . GERD (gastroesophageal reflux disease)   . Hypertension     Surgical History: Past Surgical History:  Procedure Laterality Date  . CHOLECYSTECTOMY    . TONSILLECTOMY      Home Medications:  Allergies as of 07/10/2017      Reactions   Codeine    Chlorhexidine Itching, Rash      Medication List        Accurate as of 07/10/17 11:59 PM. Always use your most recent med list.          cetirizine 10 MG tablet Commonly known as:   ZYRTEC Take 1 tablet by mouth daily.   esomeprazole 20 MG capsule Commonly known as:  NEXIUM Take by mouth.   finasteride 5 MG tablet Commonly known as:  PROSCAR Take 5 mg by mouth daily.   hydrochlorothiazide 25 MG tablet Commonly known as:  HYDRODIURIL TAKE 1 TABLET (25 MG TOTAL) BY MOUTH DAILY.   lisinopril 20 MG tablet Commonly known as:  PRINIVIL,ZESTRIL TAKE 1 TABLET (20 MG TOTAL) BY MOUTH DAILY.       Allergies:  Allergies  Allergen Reactions  . Codeine   . Chlorhexidine Itching and Rash    Family History: Family History  Problem Relation Age of Onset  . Diabetes Mother   . Lumbar disc disease Father   . Prostate cancer Father     Social History:  reports that he has never smoked. He has never used smokeless tobacco. He reports that he drinks alcohol. He reports that he does not use drugs.  ROS: UROLOGY Frequent Urination?: No Hard to postpone urination?: No Burning/pain with urination?: No Get up at night to urinate?: No Leakage of urine?: No Urine stream starts and stops?: No Trouble starting stream?: No Do you have to strain to urinate?: No Blood in urine?: No Urinary tract infection?: No Sexually transmitted disease?: No Injury to kidneys or bladder?: No Painful intercourse?:  No Weak stream?: No Erection problems?: No Penile pain?: No  Gastrointestinal Nausea?: No Vomiting?: No Indigestion/heartburn?: No Diarrhea?: No Constipation?: No  Constitutional Fever: No Night sweats?: No Weight loss?: No Fatigue?: No  Skin Skin rash/lesions?: No Itching?: No  Eyes Blurred vision?: No Double vision?: No  Ears/Nose/Throat Sore throat?: No Sinus problems?: Yes  Hematologic/Lymphatic Swollen glands?: No Easy bruising?: No  Cardiovascular Leg swelling?: No Chest pain?: No  Respiratory Cough?: No Shortness of breath?: No  Endocrine Excessive thirst?: No  Musculoskeletal Back pain?: No Joint pain?:  Yes  Neurological Headaches?: No Dizziness?: No  Psychologic Depression?: No Anxiety?: No  Physical Exam: BP (!) 164/99   Pulse (!) 114   Ht 5\' 10"  (1.778 m)   Wt 240 lb (108.9 kg)   BMI 34.44 kg/m   Constitutional:  Alert and oriented, No acute distress. HEENT: [place] AT, moist mucus membranes.  Trachea midline, no masses. Cardiovascular: No clubbing, cyanosis, or edema. Respiratory: Normal respiratory effort, no increased work of breathing. GI: Abdomen is soft, nontender, nondistended, no abdominal masses.  Obese.  Rectal exam deferred to next visit. Skin: No rashes, bruises or suspicious lesions. Neurologic: Grossly intact, no focal deficits, moving all 4 extremities. Psychiatric: Normal mood and affect.  Laboratory Data: Lab Results  Component Value Date   WBC 8.7 08/02/2016   HGB 14.8 08/02/2016   HCT 42.3 08/02/2016   MCV 90 08/02/2016   PLT 214 08/02/2016    Lab Results  Component Value Date   CREATININE 1.03 08/02/2016    Lab Results  Component Value Date   HGBA1C 6.6 (H) 02/02/2016    Urinalysis N/a  Pertinent Imaging: N/a  Assessment & Plan:    1. Elevated PSA History of markedly elevated PSA status post negative prostate biopsy Given the size of his gland, there is concern that there may be a sampling error Agree with management today Given his recently started finasteride, would like to recheck his PSA at the 5066-month mark as well as repeat a rectal exam Pending these results, will likely recommend continued surveillance versus endorectal MRI to ensure that there is no occult lesions that were not adequately sampled Patient is agreeable this plan Will return in 2 months for PSA/DRE  2. Benign prostatic hyperplasia without lower urinary tract symptoms Markedly enlarged prostate but otherwise asymptomatic without sequela Currently on finasteride presumably for chemoprevention versus diagnostic purposes I have advised him to continue this  medication for the time being at least until PSA recheck and then will consider stopping this medication in the future given that he is asymptomatic   Return in about 2 months (around 09/09/2017) for PSA/ DRE.  Vanna ScotlandAshley Kashara Blocher, MD  [facility] [address], [address] [place], [place] [address] [phone]

## 2017-08-22 ENCOUNTER — Telehealth: Payer: Self-pay | Admitting: Family Medicine

## 2017-08-22 NOTE — Telephone Encounter (Signed)
This message was meant for [place].

## 2017-08-22 NOTE — Telephone Encounter (Signed)
Please advise 

## 2017-08-22 NOTE — Telephone Encounter (Signed)
Pt's wife Darl Pikes called stating that pt had previously been a pt of yours but over the last few years has just been seen at [facility].He is wanting to get established with you again.Needs blood pressure monitored.Call back # [phone]

## 2017-08-22 NOTE — Telephone Encounter (Signed)
That's fine

## 2017-08-23 NOTE — Telephone Encounter (Signed)
Left patient a message to return call. Patient was last seen in 2012. He was seen by Dr. Sherrie Mustache for annual physical and Maurine Minister mainly for acute visits. Both providers states they were willing to re-establish patient. Advised patient to contact the office to schedule new patient appointment with which ever provider he preferred to see.

## 2017-08-23 NOTE — Telephone Encounter (Signed)
OK to re-establish. It's been 7 years.

## 2017-08-28 NOTE — Telephone Encounter (Signed)
Patient scheduled for 09/04/17

## 2017-09-04 ENCOUNTER — Ambulatory Visit: Payer: Managed Care, Other (non HMO) | Admitting: Family Medicine

## 2017-09-04 ENCOUNTER — Encounter: Payer: Self-pay | Admitting: Family Medicine

## 2017-09-04 ENCOUNTER — Other Ambulatory Visit: Payer: Self-pay | Admitting: Family Medicine

## 2017-09-04 VITALS — BP 144/98 | HR 90 | Temp 98.2°F | Ht 70.0 in | Wt 259.0 lb

## 2017-09-04 DIAGNOSIS — J0100 Acute maxillary sinusitis, unspecified: Secondary | ICD-10-CM | POA: Diagnosis not present

## 2017-09-04 DIAGNOSIS — R05 Cough: Secondary | ICD-10-CM

## 2017-09-04 DIAGNOSIS — R059 Cough, unspecified: Secondary | ICD-10-CM

## 2017-09-04 DIAGNOSIS — R972 Elevated prostate specific antigen [PSA]: Secondary | ICD-10-CM

## 2017-09-04 MED ORDER — AMOXICILLIN 875 MG PO TABS: 875.0000 mg | ORAL_TABLET | Freq: Two times a day (BID) | ORAL | 0 refills | Status: DC

## 2017-09-04 MED ORDER — BENZONATATE 200 MG PO CAPS: 200.0000 mg | ORAL_CAPSULE | Freq: Two times a day (BID) | ORAL | 0 refills | Status: DC | PRN

## 2017-09-04 NOTE — Patient Instructions (Signed)

## 2017-09-04 NOTE — Progress Notes (Signed)
Patient: Raja Liska Male    DOB: 1960/12/17   57 y.o.   MRN: [id number] Visit Date: 09/04/2017  Today's Provider: Dortha Kern, PA   Chief Complaint  Patient presents with  . Re-Establish Care   Subjective:    HPI Patient is here for follow up and re-establish care. Patient was previously a patient at [facility] employee clinic. He is a [employer] Patient is doing well on current medication regimen.  Past Medical History:  Diagnosis Date  . Allergy   . Elevated PSA   . GERD (gastroesophageal reflux disease)   . Hypertension    Past Surgical History:  Procedure Laterality Date  . CHOLECYSTECTOMY    . NECK SURGERY  2016   4-5-6 vertabae on right and 4-5 on left  . TONSILLECTOMY     Family History  Problem Relation Age of Onset  . Diabetes Mother   . Lumbar disc disease Father   . Prostate cancer Father   . Healthy Daughter   . Healthy Son   . Diabetes Maternal Grandmother   . Diabetes Paternal Grandmother    Allergies  Allergen Reactions  . Codeine   . Chlorhexidine Itching and Rash    Current Outpatient Medications:  .  albuterol (PROVENTIL HFA;VENTOLIN HFA) 108 (90 Base) MCG/ACT inhaler, Inhale 2 puffs into the lungs every 6 (six) hours as needed for wheezing or shortness of breath (Patient states he only takes if he has a URI)., Disp: , Rfl:  .  cetirizine (ZYRTEC) 10 MG tablet, Take 1 tablet by mouth daily., Disp: , Rfl:  .  esomeprazole (NEXIUM) 40 MG capsule, Take 40 mg by mouth. , Disp: , Rfl:  .  finasteride (PROSCAR) 5 MG tablet, Take 5 mg by mouth daily., Disp: , Rfl: 3 .  hydrochlorothiazide (HYDRODIURIL) 25 MG tablet, TAKE 1 TABLET (25 MG TOTAL) BY MOUTH DAILY., Disp: 90 tablet, Rfl: 1 .  lisinopril (PRINIVIL,ZESTRIL) 20 MG tablet, TAKE 1 TABLET (20 MG TOTAL) BY MOUTH DAILY., Disp: 30 tablet, Rfl: 0  Review of Systems  Constitutional: Negative.   HENT: Positive for sinus pain.   Eyes: Negative.   Respiratory: Positive for  cough.   Cardiovascular: Negative.   Gastrointestinal: Negative.   Endocrine: Negative.   Genitourinary: Negative.   Musculoskeletal: Negative.   Skin: Negative.   Allergic/Immunologic: Negative.   Neurological: Negative.   Hematological: Negative.   Psychiatric/Behavioral: Negative.    Social History   Tobacco Use  . Smoking status: Never Smoker  . Smokeless tobacco: Current User    Types: Chew  Substance Use Topics  . Alcohol use: Yes    Alcohol/week: 0.0 oz    Comment: occasionally   Objective:   BP (!) 144/98 (BP Location: Right Arm, Patient Position: Sitting, Cuff Size: Normal)   Pulse 90   Temp 98.2 F (36.8 C) (Oral)   Ht 5\' 10"  (1.778 m)   Wt 259 lb (117.5 kg)   SpO2 98%   BMI 37.16 kg/m  Vitals:   09/04/17 0840  BP: (!) 144/98  Pulse: 90  Temp: 98.2 F (36.8 C)  TempSrc: Oral  SpO2: 98%  Weight: 259 lb (117.5 kg)  Height: 5\' 10"  (1.778 m)   Physical Exam  Constitutional: He is oriented to person, place, and time. He appears well-developed and well-nourished. No distress.  HENT:  Head: Normocephalic and atraumatic.  Right Ear: Hearing and external ear normal.  Left Ear: Hearing and external ear normal.  Nose: Nose normal.  Slight tenderness right maxillary sinus with cloudy transillumination.  Eyes: Conjunctivae and lids are normal. Right eye exhibits no discharge. Left eye exhibits no discharge. No scleral icterus.  Cardiovascular: Normal rate and regular rhythm.  Pulmonary/Chest: Effort normal and breath sounds normal. No respiratory distress.  Musculoskeletal: Normal range of motion.  Neurological: He is alert and oriented to person, place, and time.  Skin: Skin is intact. No lesion and no rash noted.  Psychiatric: He has a normal mood and affect. His speech is normal and behavior is normal. Thought content normal.      Assessment & Plan:     1. Subacute maxillary sinusitis Onset 09-01-17 with ticklish evening dry cough, stuffy head, PND,  sneezing and sore throat. Recommend continuing the Zyrtec prn, add Amoxicillin with Flonase and sinus irrigation. Recheck if no better in 5-7 days. - amoxicillin (AMOXIL) 875 MG tablet; Take 1 tablet (875 mg total) by mouth 2 (two) times daily.  Dispense: 20 tablet; Refill: 0  2. Cough Onset with sinus congestion. No fever or wheezing. Recommend Mucinex-DM during the day and may add Benzonatate for night time cough suppression if needed. Increase fluid intake and recheck if no better in 5-7 days. - benzonatate (TESSALON) 200 MG capsule; Take 1 capsule (200 mg total) by mouth 2 (two) times daily as needed for cough.  Dispense: 20 capsule; Refill: 0       Dortha Kernennis Jullian Clayson, PA  [facility]

## 2017-09-06 ENCOUNTER — Other Ambulatory Visit: Payer: Self-pay | Admitting: *Deleted

## 2017-09-06 ENCOUNTER — Other Ambulatory Visit: Payer: Managed Care, Other (non HMO)

## 2017-09-06 DIAGNOSIS — R972 Elevated prostate specific antigen [PSA]: Secondary | ICD-10-CM

## 2017-09-06 DIAGNOSIS — J209 Acute bronchitis, unspecified: Secondary | ICD-10-CM

## 2017-09-06 MED ORDER — ESOMEPRAZOLE MAGNESIUM 40 MG PO CPDR: 40.0000 mg | DELAYED_RELEASE_CAPSULE | Freq: Every day | ORAL | 1 refills | Status: DC

## 2017-09-06 NOTE — Telephone Encounter (Signed)
Patient's wife Darl PikesSusan is requesting rx for 40 mg esomeprazole. Darl PikesSusan states patient has been taking otc Nexium 20 mg bid. But now his insurance will cover generic 40 mg qd. Please advise?

## 2017-09-07 LAB — PSA: PROSTATE SPECIFIC AG, SERUM: 9.2 ng/mL — AB (ref 0.0–4.0)

## 2017-09-13 ENCOUNTER — Encounter: Payer: Self-pay | Admitting: Urology

## 2017-09-13 ENCOUNTER — Ambulatory Visit (INDEPENDENT_AMBULATORY_CARE_PROVIDER_SITE_OTHER): Payer: Managed Care, Other (non HMO) | Admitting: Urology

## 2017-09-13 VITALS — BP 153/91 | HR 102 | Ht 70.0 in | Wt 240.0 lb

## 2017-09-13 DIAGNOSIS — N400 Enlarged prostate without lower urinary tract symptoms: Secondary | ICD-10-CM | POA: Diagnosis not present

## 2017-09-13 DIAGNOSIS — R972 Elevated prostate specific antigen [PSA]: Secondary | ICD-10-CM

## 2017-09-13 NOTE — Progress Notes (Signed)
09/13/2017 12:49 PM   Calvin Horton 1960-10-09 [id number]  Referring provider: Malva Limes, MD [address] [place], [place] [address]  Chief Complaint  Patient presents with  . Elevated PSA    62month    HPI: 57 year old male with a history of BPH, elevated PSA who returns today for follow up with PSA/ DRE.   Elevated PSA He has a personal history of elevated PSA as high as 16.4 as of 02/2010.  This remained around this range and ultimately underwent prostate biopsy on 11/17 with a PSA of 14.6 at that time.  This revealed acute and focal inflammation, otherwise no malignancy.  There was a small focus of atypical glands concerning for carcinoma but not diagnostic for this. TRUS vol at the time 104 g.    Biopsy complicated by pain for several days and scrotal swelling.  He notes that if he has had this in the future, he would like to have this done under anesthesia as it was incredibly uncomfortable.  Subsequently started on finasteride (? Chemoprevention).  Follow-up PSA on 03/15/2017 was 6.0.   Today, his PSA has risen to 9.2 as of 09/06/2017 despite addition of finasteride.    TRUS vol 104 g.    Continues to have few to no urinary complaints.  He does have a family history of prostate cancer in his father.  His father recently died of CHF unrelated to prostate cancer.   PMH: Past Medical History:  Diagnosis Date  . Allergy   . Elevated PSA   . GERD (gastroesophageal reflux disease)   . Hypertension     Surgical History: Past Surgical History:  Procedure Laterality Date  . CHOLECYSTECTOMY    . NECK SURGERY  2016   4-5-6 vertabae on right and 4-5 on left  . TONSILLECTOMY      Home Medications:  Allergies as of 09/13/2017      Reactions   Codeine    Chlorhexidine Itching, Rash      Medication List        Accurate as of 09/13/17 12:49 PM. Always use your most recent med list.          albuterol 108 (90 Base) MCG/ACT  inhaler Commonly known as:  PROVENTIL HFA;VENTOLIN HFA Inhale 2 puffs into the lungs every 6 (six) hours as needed for wheezing or shortness of breath (Patient states he only takes if he has a URI).   benzonatate 200 MG capsule Commonly known as:  TESSALON Take 1 capsule (200 mg total) by mouth 2 (two) times daily as needed for cough.   cetirizine 10 MG tablet Commonly known as:  ZYRTEC Take 1 tablet by mouth daily.   esomeprazole 40 MG capsule Commonly known as:  NEXIUM Take 1 capsule (40 mg total) by mouth daily.   finasteride 5 MG tablet Commonly known as:  PROSCAR Take 5 mg by mouth daily.   hydrochlorothiazide 25 MG tablet Commonly known as:  HYDRODIURIL TAKE 1 TABLET (25 MG TOTAL) BY MOUTH DAILY.   lisinopril 20 MG tablet Commonly known as:  PRINIVIL,ZESTRIL TAKE 1 TABLET (20 MG TOTAL) BY MOUTH DAILY.       Allergies:  Allergies  Allergen Reactions  . Codeine   . Chlorhexidine Itching and Rash    Family History: Family History  Problem Relation Age of Onset  . Diabetes Mother   . Lumbar disc disease Father   . Prostate cancer Father   . Healthy Daughter   . Healthy  Son   . Diabetes Maternal Grandmother   . Diabetes Paternal Grandmother     Social History:  reports that he has never smoked. His smokeless tobacco use includes chew. He reports that he drinks alcohol. He reports that he does not use drugs.  ROS: UROLOGY Frequent Urination?: No Hard to postpone urination?: No Burning/pain with urination?: No Get up at night to urinate?: No Leakage of urine?: No Urine stream starts and stops?: No Trouble starting stream?: No Do you have to strain to urinate?: No Blood in urine?: No Urinary tract infection?: No Sexually transmitted disease?: No Injury to kidneys or bladder?: No Painful intercourse?: No Weak stream?: No Erection problems?: No Penile pain?: No  Gastrointestinal Nausea?: No Vomiting?: No Indigestion/heartburn?: No Diarrhea?:  No Constipation?: No  Constitutional Fever: No Night sweats?: No Weight loss?: No Fatigue?: No  Skin Skin rash/lesions?: No Itching?: No  Eyes Blurred vision?: No Double vision?: No  Ears/Nose/Throat Sore throat?: No Sinus problems?: No  Hematologic/Lymphatic Swollen glands?: No Easy bruising?: No  Cardiovascular Leg swelling?: No Chest pain?: No  Respiratory Cough?: No Shortness of breath?: No  Endocrine Excessive thirst?: No  Musculoskeletal Back pain?: No Joint pain?: No  Neurological Headaches?: No Dizziness?: No  Psychologic Depression?: No Anxiety?: No  Physical Exam: BP (!) 153/91   Pulse (!) 102   Ht 5\' 10"  (1.778 m)   Wt 240 lb (108.9 kg)   BMI 34.44 kg/m   Constitutional:  Alert and oriented, No acute distress. HEENT: [place] AT, moist mucus membranes.  Trachea midline, no masses. Cardiovascular: No clubbing, cyanosis, or edema. Respiratory: Normal respiratory effort, no increased work of breathing. GI: Abdomen is soft, nontender, nondistended, no abdominal masses, obese Rectal: normal sphincter tone, 70 cc prostate, nontender, no nodules Skin: No rashes, bruises or suspicious lesions. Neurologic: Grossly intact, no focal deficits, moving all 4 extremities. Psychiatric: Normal mood and affect.  Laboratory Data: Lab Results  Component Value Date   WBC 8.7 08/02/2016   HGB 14.8 08/02/2016   HCT 42.3 08/02/2016   MCV 90 08/02/2016   PLT 214 08/02/2016    Lab Results  Component Value Date   CREATININE 1.03 08/02/2016    Lab Results  Component Value Date   HGBA1C 6.6 (H) 02/02/2016    Urinalysis N/a  Pertinent Imaging: N/a  Assessment & Plan:    1. Elevated PSA Elevated PSA which is rising on finasteride- highly concerning for underlying malignancy Given size of gland, higher risk for sampling error on previously prostate biopsy Recommend prostate MRI and possibly fusion biopsy as needed based on results- will call with  results once MRI complete with further recs - MR PROSTATE W WO CONTRAST; Future  2. Benign prostatic hyperplasia without lower urinary tract symptoms Currently on finasteride primarily for chemoprevention Minimally symptomatic  Return for will call with MR results and plan.  Vanna ScotlandAshley Samari Gorby, MD  [facility] [address], [address] [place], [place] [address] [phone]

## 2017-09-17 ENCOUNTER — Other Ambulatory Visit: Payer: Self-pay | Admitting: Family Medicine

## 2017-09-17 ENCOUNTER — Other Ambulatory Visit: Payer: Self-pay

## 2017-09-17 DIAGNOSIS — M4802 Spinal stenosis, cervical region: Secondary | ICD-10-CM

## 2017-09-17 MED ORDER — HYDROCHLOROTHIAZIDE 25 MG PO TABS: ORAL_TABLET | ORAL | 1 refills | Status: DC

## 2017-09-17 MED ORDER — GABAPENTIN 600 MG PO TABS: ORAL_TABLET | ORAL | 2 refills | Status: DC

## 2017-09-17 NOTE — Telephone Encounter (Signed)
Pt also requests a refill of is Gabapentin 600mg .   Thanks,   -Vernona RiegerLaura

## 2017-09-17 NOTE — Telephone Encounter (Signed)
Patient states the medication was prescribed by Greig RightSusan Fisher at the employee clinic. She prescribed Gabapentin 600 mg 1 tablet in the evening PRN. Patient states he does not have to take medication often only when he is in pain.

## 2017-09-17 NOTE — Telephone Encounter (Signed)
Who prescribed this for him? It was not in the medications listed on his chart. Need to see the bottle. Refilled the HCTZ for hypertension.

## 2017-09-17 NOTE — Progress Notes (Signed)
Requests refill of Gabapentin 600 mg one tablet daily prn pain of cervical spinal stenosis.

## 2017-09-18 NOTE — Telephone Encounter (Signed)
Dennis sent RX to pharmacy on 09/17/17

## 2017-09-18 NOTE — Telephone Encounter (Signed)
Please review note below.

## 2017-09-29 ENCOUNTER — Other Ambulatory Visit: Payer: Self-pay | Admitting: Family Medicine

## 2017-09-29 DIAGNOSIS — J209 Acute bronchitis, unspecified: Secondary | ICD-10-CM

## 2017-10-08 ENCOUNTER — Telehealth: Payer: Self-pay | Admitting: Urology

## 2017-10-08 NOTE — Telephone Encounter (Signed)
Patient is being scheduled for his prostate MRI he stated that you mentioned giving him a valium to help with his nerves. Can that be called in to his pharmacy?   Thanks, Calvin DusterMichelle

## 2017-10-09 NOTE — Telephone Encounter (Signed)
Ok to fill 

## 2017-10-11 ENCOUNTER — Other Ambulatory Visit: Payer: Self-pay | Admitting: Family Medicine

## 2017-10-11 DIAGNOSIS — M4802 Spinal stenosis, cervical region: Secondary | ICD-10-CM

## 2017-10-11 NOTE — Telephone Encounter (Signed)
I haven't seen this patient In over 5 years. i'm not sure if you feel comfortable refilling this  medication, or if he needs to re-establish with me.

## 2017-10-14 NOTE — Telephone Encounter (Signed)
Yes, OK to fill.  Please prescribe 10 mg of Valium p.o. x1.  This should be taken 1 hour prior to the procedure.  He will need a driver.  Vanna ScotlandAshley Rehanna Oloughlin, MD

## 2017-12-08 ENCOUNTER — Other Ambulatory Visit: Payer: Self-pay | Admitting: Family Medicine

## 2017-12-08 DIAGNOSIS — M4802 Spinal stenosis, cervical region: Secondary | ICD-10-CM

## 2017-12-18 ENCOUNTER — Other Ambulatory Visit: Payer: Self-pay | Admitting: Family Medicine

## 2017-12-18 DIAGNOSIS — I10 Essential (primary) hypertension: Secondary | ICD-10-CM

## 2017-12-18 MED ORDER — LISINOPRIL 20 MG PO TABS: 20.0000 mg | ORAL_TABLET | Freq: Every day | ORAL | 0 refills | Status: DC

## 2017-12-18 NOTE — Telephone Encounter (Signed)
Pt needing a refill on Rx:  lisinopril (PRINIVIL,ZESTRIL) 20 MG tablet   [facility] [place]  Please advise.  Thanks [employer]

## 2017-12-20 ENCOUNTER — Telehealth: Payer: Self-pay | Admitting: Urology

## 2017-12-20 NOTE — Telephone Encounter (Signed)
Back in July you ok'd the patient to have a valium but it doesn't look like it was ever sent in to the pharmacy. His MRI is coming up and he would like to still get this. Can we send this in to his pharmacy please?   Thanks, Marcelino DusterMichelle

## 2017-12-21 MED ORDER — DIAZEPAM 10 MG PO TABS: 10.0000 mg | ORAL_TABLET | Freq: Once | ORAL | 0 refills | Status: AC

## 2017-12-21 NOTE — Telephone Encounter (Signed)
I will leave the script up front.  Please let him know to pick it up.    Vanna ScotlandAshley Emmery Seiler, MD

## 2017-12-24 ENCOUNTER — Other Ambulatory Visit: Payer: Self-pay | Admitting: Family Medicine

## 2017-12-24 ENCOUNTER — Telehealth: Payer: Self-pay | Admitting: Urology

## 2017-12-24 DIAGNOSIS — R972 Elevated prostate specific antigen [PSA]: Secondary | ICD-10-CM

## 2017-12-24 DIAGNOSIS — J209 Acute bronchitis, unspecified: Secondary | ICD-10-CM

## 2017-12-24 NOTE — Telephone Encounter (Signed)
Pt wife call office asking for refill of Finesteride [facility] [place]. Please advise. Thanks.

## 2017-12-25 MED ORDER — FINASTERIDE 5 MG PO TABS: 5.0000 mg | ORAL_TABLET | Freq: Every day | ORAL | 3 refills | Status: DC

## 2017-12-25 NOTE — Telephone Encounter (Signed)
Called pt informed him that refill had been sent in. Verbal understanding given.

## 2017-12-25 NOTE — Addendum Note (Signed)
Addended by: Frankey ShownGLANTON, Eduin Friedel C on: 12/25/2017 10:51 AM   Modules accepted: Orders

## 2018-01-01 ENCOUNTER — Ambulatory Visit
Admission: RE | Admit: 2018-01-01 | Discharge: 2018-01-01 | Disposition: A | Discharge status: Home / Self Care | Payer: Managed Care, Other (non HMO) | Source: Ambulatory Visit | Attending: Urology | Admitting: Urology

## 2018-01-01 DIAGNOSIS — R972 Elevated prostate specific antigen [PSA]: Secondary | ICD-10-CM

## 2018-01-01 MED ORDER — GADOBENATE DIMEGLUMINE 529 MG/ML IV SOLN
20.0000 mL | Freq: Once | INTRAVENOUS | Status: AC | PRN
  Administered 2018-01-01: 20 mL via INTRAVENOUS

## 2018-01-03 ENCOUNTER — Other Ambulatory Visit: Payer: Self-pay | Admitting: Family Medicine

## 2018-01-03 DIAGNOSIS — M4802 Spinal stenosis, cervical region: Secondary | ICD-10-CM

## 2018-01-07 ENCOUNTER — Telehealth: Payer: Self-pay | Admitting: Urology

## 2018-01-07 DIAGNOSIS — N400 Enlarged prostate without lower urinary tract symptoms: Secondary | ICD-10-CM

## 2018-01-07 NOTE — Telephone Encounter (Signed)
Call patient to review prostate MRI.  There are no suspicious lesions which is very reassuring.  At this point in time, I would like him to follow-up in 4 months with PSA prior.  He is agreeable with this plan.  Vanna [place], MD

## 2018-01-10 NOTE — Telephone Encounter (Signed)
apps made and mailed to patient ° °MB °

## 2018-01-12 ENCOUNTER — Other Ambulatory Visit: Payer: Self-pay | Admitting: Family Medicine

## 2018-01-12 DIAGNOSIS — I10 Essential (primary) hypertension: Secondary | ICD-10-CM

## 2018-02-09 ENCOUNTER — Other Ambulatory Visit: Payer: Self-pay | Admitting: Family Medicine

## 2018-02-09 DIAGNOSIS — I10 Essential (primary) hypertension: Secondary | ICD-10-CM

## 2018-03-13 ENCOUNTER — Other Ambulatory Visit: Payer: Self-pay | Admitting: Family Medicine

## 2018-03-13 DIAGNOSIS — I10 Essential (primary) hypertension: Secondary | ICD-10-CM

## 2018-04-11 ENCOUNTER — Other Ambulatory Visit: Payer: Self-pay | Admitting: Family Medicine

## 2018-04-11 DIAGNOSIS — I10 Essential (primary) hypertension: Secondary | ICD-10-CM

## 2018-04-19 NOTE — Progress Notes (Signed)
Patient: Calvin Horton Male    DOB: 1960-06-15   58 y.o.   MRN: [id number] Visit Date: 04/22/2018  Today's Provider: Dortha Kernennis Adrina Armijo, PA   Chief Complaint  Patient presents with  . Follow-up   Subjective:     HPI  Patient comes in today for follow-up medications. Reports that he needs refill on the following medicines. Lisinopril and Hydrocholorothiazide. Patient reports that his blood pressure at home is controlled. Reports it reads in 130's/80. He joined the gym, goes 3(three) times a week.  Wt Readings from Last 3 Encounters:  04/22/18 263 lb (119.3 kg)  09/13/17 240 lb (108.9 kg)  09/04/17 259 lb (117.5 kg)   BP Readings from Last 3 Encounters:  04/22/18 (!) 154/99  09/13/17 (!) 153/91  09/04/17 (!) 144/98   Spinal Stenosis:Patient also wants to see if his Gabapentin can be change to Celebrex.  Patient also need his wellness screening form to be fill out. His waist Circumference today is 47 1/2 inches.  Past Medical History:  Diagnosis Date  . Allergy   . Elevated PSA   . GERD (gastroesophageal reflux disease)   . Hypertension    Past Surgical History:  Procedure Laterality Date  . CHOLECYSTECTOMY    . NECK SURGERY  2016   4-5-6 vertabae on right and 4-5 on left  . TONSILLECTOMY     Family History  Problem Relation Age of Onset  . Diabetes Mother   . Lumbar disc disease Father   . Prostate cancer Father   . Healthy Daughter   . Healthy Son   . Diabetes Maternal Grandmother   . Diabetes Paternal Grandmother    Allergies  Allergen Reactions  . Codeine   . Chlorhexidine Itching and Rash    Current Outpatient Medications:  .  esomeprazole (NEXIUM) 40 MG capsule, TAKE 1 CAPSULE BY MOUTH EVERY DAY, Disp: 90 capsule, Rfl: 4 .  finasteride (PROSCAR) 5 MG tablet, Take 1 tablet (5 mg total) by mouth daily., Disp: 90 tablet, Rfl: 3 .  gabapentin (NEURONTIN) 600 MG tablet, TAKE ONE TABLET BY MOUTH DAILY AS NEEDED FOR NECK PAIN., Disp: 90 tablet,  Rfl: 1 .  hydrochlorothiazide (HYDRODIURIL) 25 MG tablet, TAKE 1 TABLET (25 MG TOTAL) BY MOUTH DAILY., Disp: 90 tablet, Rfl: 1 .  lisinopril (PRINIVIL,ZESTRIL) 20 MG tablet, TAKE 1 TABLET BY MOUTH EVERY DAY, Disp: 30 tablet, Rfl: 0 .  albuterol (PROVENTIL HFA;VENTOLIN HFA) 108 (90 Base) MCG/ACT inhaler, Inhale 2 puffs into the lungs every 6 (six) hours as needed for wheezing or shortness of breath (Patient states he only takes if he has a URI)., Disp: , Rfl:  .  cetirizine (ZYRTEC) 10 MG tablet, Take 1 tablet by mouth daily., Disp: , Rfl:   Review of Systems  Eyes: Negative for visual disturbance.  Cardiovascular: Negative for chest pain, palpitations and leg swelling.  Neurological: Negative for dizziness, light-headedness and headaches.   Social History   Tobacco Use  . Smoking status: Never Smoker  . Smokeless tobacco: Current User    Types: Chew  Substance Use Topics  . Alcohol use: Yes    Alcohol/week: 0.0 standard drinks    Comment: occasionally     Objective:   BP (!) 154/99 (BP Location: Right Arm, Patient Position: Sitting, Cuff Size: Large)   Pulse (!) 108   Temp 98.2 F (36.8 C) (Oral)   Resp 16   Ht 5\' 10"  (1.778 m)   Wt 263 lb (119.3 kg)  BMI 37.74 kg/m  Vitals:   04/22/18 1525  BP: (!) 154/99  Pulse: (!) 108  Resp: 16  Temp: 98.2 F (36.8 C)  TempSrc: Oral  Weight: 263 lb (119.3 kg)  Height: 5\' 10"  (1.778 m)   Physical Exam Constitutional:      General: He is not in acute distress.    Appearance: Normal appearance. He is well-developed. He is obese.  HENT:     Head: Normocephalic and atraumatic.     Right Ear: Hearing, tympanic membrane and ear canal normal.     Left Ear: Hearing, tympanic membrane and ear canal normal.     Nose: Nose normal.     Mouth/Throat:     Mouth: Mucous membranes are moist.     Pharynx: Oropharynx is clear.  Eyes:     General: Lids are normal. No scleral icterus.       Right eye: No discharge.        Left eye: No  discharge.     Extraocular Movements: Extraocular movements intact.     Conjunctiva/sclera: Conjunctivae normal.  Neck:     Musculoskeletal: Normal range of motion and neck supple.     Vascular: No carotid bruit.  Cardiovascular:     Rate and Rhythm: Normal rate and regular rhythm.     Pulses: Normal pulses.     Heart sounds: Normal heart sounds.  Pulmonary:     Effort: Pulmonary effort is normal. No respiratory distress.     Breath sounds: Normal breath sounds.  Abdominal:     General: Bowel sounds are normal.     Palpations: Abdomen is soft.  Musculoskeletal: Normal range of motion.  Lymphadenopathy:     Cervical: No cervical adenopathy.  Skin:    Findings: No lesion or rash.  Neurological:     Mental Status: He is alert and oriented to person, place, and time.  Psychiatric:        Speech: Speech normal.        Behavior: Behavior normal.        Thought Content: Thought content normal.       Assessment & Plan    1. Encounter for biometric screening Due for annual biometric screening at work. Feeling stable. Will get labs and follow up pending reports. - Comprehensive metabolic panel - CBC with Differential/Platelet - Lipid panel - TSH  2. Essential hypertension, benign BP slightly elevated with pulse a little high, also. Need refill of BP meds and routine follow up labs. No chest pains, dyspnea, edema or palpitations. - Comprehensive metabolic panel - CBC with Differential/Platelet - Lipid panel - TSH - lisinopril-hydrochlorothiazide (PRINZIDE,ZESTORETIC) 20-25 MG tablet; Take 1 tablet by mouth daily.  Dispense: 90 tablet; Refill: 3  3. Gastroesophageal reflux disease without esophagitis Still taking Nexium daily for reflux/dyspepsia control. No hematemesis, hematochezia or melena. No abdominal pain. Will check routine labs and continue present medications. May need referral to GI for upper endoscopy if any anemia. - Comprehensive metabolic panel  4. Spinal  stenosis in cervical region History of diskectomy with allograft and plating surgery at C4-5, C5-6 and C6-7 level in 2016 by Dr. Rise Mu at [facility]. Feeling well and still has some ache that is helped by Celebrex. Will refill medications and recheck pending lab reports. - celecoxib (CELEBREX) 200 MG capsule; Take 1 capsule (200 mg total) by mouth 2 (two) times daily.  Dispense: 60 capsule; Refill: 1      Dortha Kern, PA  [facility]

## 2018-04-22 ENCOUNTER — Ambulatory Visit: Payer: Managed Care, Other (non HMO) | Admitting: Family Medicine

## 2018-04-22 ENCOUNTER — Encounter: Payer: Self-pay | Admitting: Family Medicine

## 2018-04-22 VITALS — BP 154/99 | HR 108 | Temp 98.2°F | Resp 16 | Ht 70.0 in | Wt 263.0 lb

## 2018-04-22 DIAGNOSIS — I10 Essential (primary) hypertension: Secondary | ICD-10-CM

## 2018-04-22 DIAGNOSIS — Z0189 Encounter for other specified special examinations: Secondary | ICD-10-CM | POA: Diagnosis not present

## 2018-04-22 DIAGNOSIS — K219 Gastro-esophageal reflux disease without esophagitis: Secondary | ICD-10-CM | POA: Diagnosis not present

## 2018-04-22 DIAGNOSIS — M4802 Spinal stenosis, cervical region: Secondary | ICD-10-CM

## 2018-04-22 DIAGNOSIS — Z008 Encounter for other general examination: Secondary | ICD-10-CM

## 2018-04-22 MED ORDER — LISINOPRIL-HYDROCHLOROTHIAZIDE 20-25 MG PO TABS: 1.0000 | ORAL_TABLET | Freq: Every day | ORAL | 3 refills | Status: DC

## 2018-04-22 MED ORDER — CELECOXIB 200 MG PO CAPS: 200.0000 mg | ORAL_CAPSULE | Freq: Two times a day (BID) | ORAL | 1 refills | Status: DC

## 2018-04-24 ENCOUNTER — Other Ambulatory Visit: Payer: Self-pay | Admitting: Family Medicine

## 2018-04-24 LAB — CBC WITH DIFFERENTIAL/PLATELET
BASOS: 0 %
Basophils Absolute: 0 10*3/uL (ref 0.0–0.2)
EOS (ABSOLUTE): 0.1 10*3/uL (ref 0.0–0.4)
EOS: 1 %
HEMATOCRIT: 45.2 % (ref 37.5–51.0)
HEMOGLOBIN: 15.7 g/dL (ref 13.0–17.7)
IMMATURE GRANS (ABS): 0 10*3/uL (ref 0.0–0.1)
IMMATURE GRANULOCYTES: 0 %
LYMPHS: 19 %
Lymphocytes Absolute: 2 10*3/uL (ref 0.7–3.1)
MCH: 32.9 pg (ref 26.6–33.0)
MCHC: 34.7 g/dL (ref 31.5–35.7)
MCV: 95 fL (ref 79–97)
MONOCYTES: 7 %
MONOS ABS: 0.7 10*3/uL (ref 0.1–0.9)
NEUTROS PCT: 73 %
Neutrophils Absolute: 7.7 10*3/uL — ABNORMAL HIGH (ref 1.4–7.0)
Platelets: 228 10*3/uL (ref 150–450)
RBC: 4.77 x10E6/uL (ref 4.14–5.80)
RDW: 12.3 % (ref 11.6–15.4)
WBC: 10.6 10*3/uL (ref 3.4–10.8)

## 2018-04-24 LAB — COMPREHENSIVE METABOLIC PANEL
A/G RATIO: 2 (ref 1.2–2.2)
ALT: 36 IU/L (ref 0–44)
AST: 33 IU/L (ref 0–40)
Albumin: 4.5 g/dL (ref 3.8–4.9)
Alkaline Phosphatase: 85 IU/L (ref 39–117)
BILIRUBIN TOTAL: 1.1 mg/dL (ref 0.0–1.2)
BUN / CREAT RATIO: 12 (ref 9–20)
BUN: 12 mg/dL (ref 6–24)
CO2: 24 mmol/L (ref 20–29)
Calcium: 9.6 mg/dL (ref 8.7–10.2)
Chloride: 93 mmol/L — ABNORMAL LOW (ref 96–106)
Creatinine, Ser: 0.99 mg/dL (ref 0.76–1.27)
GFR calc non Af Amer: 85 mL/min/{1.73_m2} (ref >59)
GFR, EST AFRICAN AMERICAN: 98 mL/min/{1.73_m2} (ref >59)
GLOBULIN, TOTAL: 2.2 g/dL (ref 1.5–4.5)
Glucose: 238 mg/dL — ABNORMAL HIGH (ref 65–99)
Potassium: 4.4 mmol/L (ref 3.5–5.2)
SODIUM: 138 mmol/L (ref 134–144)
TOTAL PROTEIN: 6.7 g/dL (ref 6.0–8.5)

## 2018-04-24 LAB — LIPID PANEL
CHOL/HDL RATIO: 3.2 ratio (ref 0.0–5.0)
Cholesterol, Total: 206 mg/dL — ABNORMAL HIGH (ref 100–199)
HDL: 65 mg/dL (ref >39)
LDL CALC: 105 mg/dL — AB (ref 0–99)
Triglycerides: 180 mg/dL — ABNORMAL HIGH (ref 0–149)
VLDL Cholesterol Cal: 36 mg/dL (ref 5–40)

## 2018-04-24 LAB — TSH: TSH: 1.43 u[IU]/mL (ref 0.450–4.500)

## 2018-04-26 ENCOUNTER — Telehealth: Payer: Self-pay

## 2018-04-26 ENCOUNTER — Telehealth: Payer: Self-pay | Admitting: Family Medicine

## 2018-04-26 DIAGNOSIS — R7301 Impaired fasting glucose: Secondary | ICD-10-CM

## 2018-04-26 MED ORDER — METFORMIN HCL 500 MG PO TABS: 500.0000 mg | ORAL_TABLET | Freq: Every day | ORAL | 3 refills | Status: DC

## 2018-04-26 NOTE — Telephone Encounter (Signed)
Patient requested for [facility] to [facility] [place].  Metformin sent to [facility] in [place] and scheduled patient for a 2 week follow-up.

## 2018-04-26 NOTE — Telephone Encounter (Signed)
Glipizide more likely to cause hypoglycemia (low blood sugar) episodes. Should have him come in to be taught how to use a glucometer (we have machines as samples in the closet). His blood tests did not show any evidence of kidney disease that would be a contraindication to using the Metformin. If his mother was a diabetic, the disease will cause kidney failure and is the number one cause for people to go on dialysis. Keeping the blood sugar controlled will help prevent kidney disease.

## 2018-04-26 NOTE — Telephone Encounter (Signed)
-----   Message from Tamsen Roers, [place] sent at 04/26/2018  8:26 AM EST ----- Blood sugar high into diabetic range. Ask lab to run Hgb A1C with diagnosis of elevated blood sugar. Probably needs referral to [facility] for diabetic education and start Metformin 500 mg qd #30 & 3 RF. Schedule follow up in 2 weeks and train in use of glucometer.

## 2018-04-26 NOTE — Telephone Encounter (Signed)
Please Review

## 2018-04-26 NOTE — Telephone Encounter (Signed)
Called the lab to add the A1C  Once they verify that they have enough blood to run it they will send a form to fax back.

## 2018-04-26 NOTE — Telephone Encounter (Signed)
Patient does not want to take Metformin due to his mother having kidney failure on this and passed away.  He wants to try Glipizide instead.

## 2018-04-27 ENCOUNTER — Other Ambulatory Visit: Payer: Self-pay | Admitting: Family Medicine

## 2018-04-27 DIAGNOSIS — E119 Type 2 diabetes mellitus without complications: Secondary | ICD-10-CM

## 2018-04-27 MED ORDER — GLIPIZIDE 5 MG PO TABS: 5.0000 mg | ORAL_TABLET | Freq: Every day | ORAL | 3 refills | Status: DC

## 2018-04-29 ENCOUNTER — Telehealth: Payer: Self-pay

## 2018-04-29 LAB — SPECIMEN STATUS REPORT

## 2018-04-29 LAB — HGB A1C W/O EAG: Hgb A1c MFr Bld: 10 % — ABNORMAL HIGH (ref 4.8–5.6)

## 2018-04-29 NOTE — Telephone Encounter (Signed)
-----   Message from Dennis E Chrismon, PA sent at 04/27/2018  9:26 AM EST ----- Hgb A1C very high at 10.0 (diabetes is >6.4). Can try Glipizide 5 mg q morning #30 & 3 RF but must schedule follow up appointment to get started on glucometer training and monitoring blood sugar level. Metformin is the usual first line choice of drugs for diabetes because of the way it works in the body. 

## 2018-04-29 NOTE — Telephone Encounter (Signed)
Patient's wife Darl Pikes was advised of lab results and about the medication

## 2018-04-29 NOTE — Telephone Encounter (Signed)
Pt called back to speak with nurse.  He said it's ok to call back and speak with his wife Darl Pikes at [phone].  Thanks, [employer]

## 2018-04-29 NOTE — Telephone Encounter (Signed)
-----   Message from Kohl'sDennis E Chrismon, [place] sent at 04/27/2018  9:26 AM EST ----- Hgb A1C very high at 10.0 (diabetes is >6.4). Can try Glipizide 5 mg q morning #30 & 3 RF but must schedule follow up appointment to get started on glucometer training and monitoring blood sugar level. Metformin is the usual first line choice of drugs for diabetes because of the way it works in the body.

## 2018-04-29 NOTE — Telephone Encounter (Signed)
Hgb A1C very high at 10.0 (diabetes is >6.4). Can try Glipizide 5 mg q morning #30 & 3 RF but must schedule follow up appointment to get started on glucometer training and monitoring blood sugar level. Metformin is the usual first line choice of drugs for diabetes because of the way it works in the body.

## 2018-04-29 NOTE — Telephone Encounter (Signed)
Need follow up and glucometer training in the next 2 weeks. Can get authorization on HIPAA forms at that time so this won't be so difficult in the future.

## 2018-04-29 NOTE — Telephone Encounter (Signed)
LM with Emergency contact for patient to call back the office. Per emergency contact Darl Pikes that it was ok to give her the information because she is the one that always call for him. Bernerd Limbo that she was not on the [facility] and that we would need to update that and I can give her the information if that is what the patient wants but he would need to call and said that he gives permission.  She reported that patient got the Glipizide already incase I was calling for that.

## 2018-04-29 NOTE — Telephone Encounter (Signed)
Please see other telephone encounter.

## 2018-05-01 ENCOUNTER — Telehealth: Payer: Self-pay | Admitting: Family Medicine

## 2018-05-01 NOTE — Telephone Encounter (Signed)
Opened in error

## 2018-05-09 NOTE — Progress Notes (Signed)
Patient: Calvin Horton Male    DOB: Aug 27, 1960   58 y.o.   MRN: [id number] Visit Date: 05/10/2018  Today's Provider: Dortha Kernennis Chrismon, PA   Chief Complaint  Patient presents with  . Diabetes   Subjective:     HPI   Last office visit 04/22/2018. Labs checked showing: Hgb A1C very high at 10.0 (diabetes is >6.4). Can try Glipizide 5 mg q morning #30 & 3 RF but must schedule follow up appointment to get started on glucometer training and monitoring blood sugar level. Metformin is the usual first line choice of drugs for diabetes because of the way it works in the body. FBS are averaging 130's. Lowest FBS was 115 since patient started checking them.  Lab Results  Component Value Date   HGBA1C 10.0 (H) 04/23/2018   Past Medical History:  Diagnosis Date  . Allergy   . Elevated PSA   . GERD (gastroesophageal reflux disease)   . Hypertension    Past Surgical History:  Procedure Laterality Date  . CHOLECYSTECTOMY    . NECK SURGERY  2016   4-5-6 vertabae on right and 4-5 on left  . TONSILLECTOMY     Family History  Problem Relation Age of Onset  . Diabetes Mother   . Lumbar disc disease Father   . Prostate cancer Father   . Healthy Daughter   . Healthy Son   . Diabetes Maternal Grandmother   . Diabetes Paternal Grandmother    Allergies  Allergen Reactions  . Codeine   . Chlorhexidine Itching and Rash    Current Outpatient Medications:  .  albuterol (PROVENTIL HFA;VENTOLIN HFA) 108 (90 Base) MCG/ACT inhaler, Inhale 2 puffs into the lungs every 6 (six) hours as needed for wheezing or shortness of breath (Patient states he only takes if he has a URI)., Disp: , Rfl:  .  celecoxib (CELEBREX) 200 MG capsule, Take 1 capsule (200 mg total) by mouth 2 (two) times daily., Disp: 60 capsule, Rfl: 1 .  cetirizine (ZYRTEC) 10 MG tablet, Take 1 tablet by mouth daily., Disp: , Rfl:  .  esomeprazole (NEXIUM) 40 MG capsule, TAKE 1 CAPSULE BY MOUTH EVERY DAY, Disp: 90 capsule,  Rfl: 4 .  finasteride (PROSCAR) 5 MG tablet, Take 1 tablet (5 mg total) by mouth daily., Disp: 90 tablet, Rfl: 3 .  gabapentin (NEURONTIN) 600 MG tablet, TAKE ONE TABLET BY MOUTH DAILY AS NEEDED FOR NECK PAIN., Disp: 90 tablet, Rfl: 1 .  glipiZIDE (GLUCOTROL) 5 MG tablet, Take 1 tablet (5 mg total) by mouth daily before breakfast., Disp: 30 tablet, Rfl: 3 .  lisinopril-hydrochlorothiazide (PRINZIDE,ZESTORETIC) 20-25 MG tablet, Take 1 tablet by mouth daily., Disp: 90 tablet, Rfl: 3  Review of Systems  Constitutional: Negative for appetite change, chills and fever.  Respiratory: Negative for chest tightness, shortness of breath and wheezing.   Cardiovascular: Negative for chest pain and palpitations.  Gastrointestinal: Negative for abdominal pain, nausea and vomiting.   Social History   Tobacco Use  . Smoking status: Never Smoker  . Smokeless tobacco: Current User    Types: Chew  Substance Use Topics  . Alcohol use: Yes    Alcohol/week: 0.0 standard drinks    Comment: occasionally     Objective:   BP (!) 142/82 (BP Location: Right Arm, Patient Position: Sitting, Cuff Size: Large)   Pulse 99   Temp 98.4 F (36.9 C) (Oral)   Wt 258 lb 6.4 oz (117.2 kg)  SpO2 94%   BMI 37.08 kg/m  Vitals:   05/10/18 0803  BP: (!) 142/82  Pulse: 99  Temp: 98.4 F (36.9 C)  TempSrc: Oral  SpO2: 94%  Weight: 258 lb 6.4 oz (117.2 kg)   Physical Exam Constitutional:      General: He is not in acute distress.    Appearance: He is well-developed.  HENT:     Head: Normocephalic and atraumatic.     Right Ear: Hearing and tympanic membrane normal.     Left Ear: Hearing and tympanic membrane normal.     Nose: Nose normal.  Eyes:     General: Lids are normal. No scleral icterus.       Right eye: No discharge.        Left eye: No discharge.     Conjunctiva/sclera: Conjunctivae normal.  Neck:     Musculoskeletal: Neck supple.  Cardiovascular:     Rate and Rhythm: Normal rate and regular  rhythm.     Heart sounds: Normal heart sounds.  Pulmonary:     Effort: Pulmonary effort is normal. No respiratory distress.  Musculoskeletal: Normal range of motion.  Skin:    Findings: No lesion or rash.  Neurological:     Mental Status: He is alert and oriented to person, place, and time.  Psychiatric:        Speech: Speech normal.        Behavior: Behavior normal.        Thought Content: Thought content normal.       Assessment & Plan    1. Diabetes mellitus without complication (HCC) Hgb A1C was 10.0% with FBS 238 on 04-29-18. Started Glipizide 5 mg qd with diabetic diet and working on weight loss. Feeling well and FBS at home has been in the 115-150 range. No polydipsia, polyphagia, peripheral neuropathy or blurring of vision. No side effects from Glipizide or hypoglycemic episodes. Will continue present regimen and recheck in 2.5 months for repeat labs. Recent Results (from the past 2160 hour(s))  Comprehensive metabolic panel     Status: Abnormal   Collection Time: 04/23/18  9:40 AM  Result Value Ref Range   Glucose 238 (H) 65 - 99 mg/dL   BUN 12 6 - 24 mg/dL   Creatinine, Ser 1.61 0.76 - 1.27 mg/dL   GFR calc non Af Amer 85 >59 mL/min/1.73   GFR calc Af Amer 98 >59 mL/min/1.73   BUN/Creatinine Ratio 12 9 - 20   Sodium 138 134 - 144 mmol/L   Potassium 4.4 3.5 - 5.2 mmol/L   Chloride 93 (L) 96 - 106 mmol/L   CO2 24 20 - 29 mmol/L   Calcium 9.6 8.7 - 10.2 mg/dL   Total Protein 6.7 6.0 - 8.5 g/dL   Albumin 4.5 3.8 - 4.9 g/dL    Comment:               **Please note reference interval change**   Globulin, Total 2.2 1.5 - 4.5 g/dL   Albumin/Globulin Ratio 2.0 1.2 - 2.2   Bilirubin Total 1.1 0.0 - 1.2 mg/dL   Alkaline Phosphatase 85 39 - 117 IU/L   AST 33 0 - 40 IU/L   ALT 36 0 - 44 IU/L  CBC with Differential/Platelet     Status: Abnormal   Collection Time: 04/23/18  9:40 AM  Result Value Ref Range   WBC 10.6 3.4 - 10.8 x10E3/uL   RBC 4.77 4.14 - 5.80 x10E6/uL    Hemoglobin 15.7 13.0 -  17.7 g/dL   Hematocrit 81.145.2 91.437.5 - 51.0 %   MCV 95 79 - 97 fL   MCH 32.9 26.6 - 33.0 pg   MCHC 34.7 31.5 - 35.7 g/dL   RDW 78.212.3 95.611.6 - 21.315.4 %   Platelets 228 150 - 450 x10E3/uL   Neutrophils 73 Not Estab. %   Lymphs 19 Not Estab. %   Monocytes 7 Not Estab. %   Eos 1 Not Estab. %   Basos 0 Not Estab. %   Neutrophils Absolute 7.7 (H) 1.4 - 7.0 x10E3/uL   Lymphocytes Absolute 2.0 0.7 - 3.1 x10E3/uL   Monocytes Absolute 0.7 0.1 - 0.9 x10E3/uL   EOS (ABSOLUTE) 0.1 0.0 - 0.4 x10E3/uL   Basophils Absolute 0.0 0.0 - 0.2 x10E3/uL   Immature Granulocytes 0 Not Estab. %   Immature Grans (Abs) 0.0 0.0 - 0.1 x10E3/uL  Lipid panel     Status: Abnormal   Collection Time: 04/23/18  9:40 AM  Result Value Ref Range   Cholesterol, Total 206 (H) 100 - 199 mg/dL   Triglycerides 086180 (H) 0 - 149 mg/dL   HDL 65 >57>39 mg/dL   VLDL Cholesterol Cal 36 5 - 40 mg/dL   LDL Calculated 846105 (H) 0 - 99 mg/dL   Chol/HDL Ratio 3.2 0.0 - 5.0 ratio    Comment:                                   T. Chol/HDL Ratio                                             Men  Women                               1/2 Avg.Risk  3.4    3.3                                   Avg.Risk  5.0    4.4                                2X Avg.Risk  9.6    7.1                                3X Avg.Risk 23.4   11.0   TSH     Status: None   Collection Time: 04/23/18  9:40 AM  Result Value Ref Range   TSH 1.430 0.450 - 4.500 uIU/mL  Hgb A1c w/o eAG     Status: Abnormal   Collection Time: 04/23/18  9:40 AM  Result Value Ref Range   Hgb A1c MFr Bld 10.0 (H) 4.8 - 5.6 %    Comment:          Prediabetes: 5.7 - 6.4          Diabetes: >6.4          Glycemic control for adults with diabetes: <7.0   Specimen status report     Status: None   Collection Time: 04/23/18  9:40 AM  Result Value  Ref Range   specimen status report Comment     Comment: Written Authorization Written Authorization Written Authorization  Received. Authorization received from Vaiden Chrismon 04-29-2018 Logged by Orma Render, PA  [facility]

## 2018-05-10 ENCOUNTER — Ambulatory Visit: Payer: Managed Care, Other (non HMO) | Admitting: Family Medicine

## 2018-05-10 ENCOUNTER — Other Ambulatory Visit: Payer: Managed Care, Other (non HMO)

## 2018-05-10 ENCOUNTER — Encounter: Payer: Self-pay | Admitting: Family Medicine

## 2018-05-10 VITALS — BP 142/82 | HR 99 | Temp 98.4°F | Wt 258.4 lb

## 2018-05-10 DIAGNOSIS — N400 Enlarged prostate without lower urinary tract symptoms: Secondary | ICD-10-CM

## 2018-05-10 DIAGNOSIS — M1990 Unspecified osteoarthritis, unspecified site: Secondary | ICD-10-CM | POA: Insufficient documentation

## 2018-05-10 DIAGNOSIS — E119 Type 2 diabetes mellitus without complications: Secondary | ICD-10-CM | POA: Diagnosis not present

## 2018-05-10 DIAGNOSIS — I10 Essential (primary) hypertension: Secondary | ICD-10-CM | POA: Insufficient documentation

## 2018-05-11 LAB — PSA: PROSTATE SPECIFIC AG, SERUM: 4.3 ng/mL — AB (ref 0.0–4.0)

## 2018-05-14 ENCOUNTER — Ambulatory Visit: Payer: Managed Care, Other (non HMO) | Admitting: Urology

## 2018-05-23 NOTE — Progress Notes (Signed)
05/29/2018 9:06 AM   Calvin Horton [birth year] [id number]  Referring provider: Malva Limes, MD [address] [place], [place] [address]  Chief Complaint  Patient presents with  . Elevated PSA    4 month    HPI: Calvin Horton is a 58 yo M with a history of BPH and elevated PSA who returns today for a f/u with PSA/DRE.   Elevated PSA He has a personal history of elevated PSA as high as 16.4 as of 02/2010. This remained around this range and ultimately underwent prostate biopsy on 11/17 with a PSA of 14.6 at that time. This revealed acute and focal inflammation, otherwise no malignancy. There was a small focus of atypical glands concerning for carcinoma but not diagnostic for this. TRUS vol at the time was 104 g.   Biopsy complicated by pain for several days and scrotal swelling.He notes that if he has had this in the future, he would like to have this done under anesthesia as it was incredibly uncomfortable.  Subsequently started on finasteride(? Chemoprevention). Follow-up PSA on 03/15/2017 was 6.0. His PSA has risen to 9.2 as of 09/06/2017 despite addition of finasteride.    His most recent PSA is 4.3 from 05/10/2018.   He reports of nocturia every 3 hours from his last visit however is currently having no symptoms and feels great. His urinary stream is stronger also. He believes his better control of DM (recently dx) and use of finasteride can be the cause for this improvement in urinary symptoms.   He does have a family history of prostate cancer in his father. His father recently died of CHF unrelated to prostate cancer.  Component     Latest Ref Rng & Units 02/02/2016 08/02/2016 09/06/2017 05/10/2018  Prostate Specific Ag, Serum     0.0 - 4.0 ng/mL 14.6 (H) 8.7 (H) 9.2 (H) 4.3 (H)    PMH: Past Medical History:  Diagnosis Date  . Allergy   . Elevated PSA   . GERD (gastroesophageal reflux disease)   . Hypertension     Surgical History: Past  Surgical History:  Procedure Laterality Date  . CHOLECYSTECTOMY    . NECK SURGERY  2016   4-5-6 vertabae on right and 4-5 on left  . TONSILLECTOMY      Home Medications:  Allergies as of 05/29/2018      Reactions   Codeine    Chlorhexidine Itching, Rash      Medication List       Accurate as of May 29, 2018  9:06 AM. Always use your most recent med list.        albuterol 108 (90 Base) MCG/ACT inhaler Commonly known as:  PROVENTIL HFA;VENTOLIN HFA Inhale 2 puffs into the lungs every 6 (six) hours as needed for wheezing or shortness of breath (Patient states he only takes if he has a URI).   celecoxib 200 MG capsule Commonly known as:  CELEBREX Take 1 capsule (200 mg total) by mouth 2 (two) times daily.   cetirizine 10 MG tablet Commonly known as:  ZYRTEC Take 1 tablet by mouth daily.   esomeprazole 40 MG capsule Commonly known as:  NEXIUM TAKE 1 CAPSULE BY MOUTH EVERY DAY   finasteride 5 MG tablet Commonly known as:  PROSCAR Take 1 tablet (5 mg total) by mouth daily.   gabapentin 600 MG tablet Commonly known as:  NEURONTIN TAKE ONE TABLET BY MOUTH DAILY AS NEEDED FOR NECK PAIN.   glipiZIDE 5  MG tablet Commonly known as:  GLUCOTROL Take 1 tablet (5 mg total) by mouth daily before breakfast.   lisinopril-hydrochlorothiazide 20-25 MG tablet Commonly known as:  PRINZIDE,ZESTORETIC Take 1 tablet by mouth daily.       Allergies:  Allergies  Allergen Reactions  . Codeine   . Chlorhexidine Itching and Rash    Family History: Family History  Problem Relation Age of Onset  . Diabetes Mother   . Lumbar disc disease Father   . Prostate cancer Father   . Healthy Daughter   . Healthy Son   . Diabetes Maternal Grandmother   . Diabetes Paternal Grandmother     Social History:  reports that he has never smoked. His smokeless tobacco use includes chew. He reports current alcohol use. He reports that he does not use drugs.  ROS: UROLOGY Frequent  Urination?: No Hard to postpone urination?: No Burning/pain with urination?: No Get up at night to urinate?: No Leakage of urine?: No Urine stream starts and stops?: No Trouble starting stream?: No Do you have to strain to urinate?: No Blood in urine?: No Urinary tract infection?: No Sexually transmitted disease?: No Injury to kidneys or bladder?: No Painful intercourse?: No Weak stream?: No Erection problems?: No Penile pain?: No  Gastrointestinal Nausea?: No Vomiting?: No Indigestion/heartburn?: Yes Diarrhea?: No Constipation?: No  Constitutional Fever: No Night sweats?: No Weight loss?: No Fatigue?: No  Skin Skin rash/lesions?: No Itching?: No  Eyes Blurred vision?: No Double vision?: No  Ears/Nose/Throat Sore throat?: No Sinus problems?: No  Hematologic/Lymphatic Swollen glands?: No Easy bruising?: No  Cardiovascular Leg swelling?: No Chest pain?: No  Respiratory Cough?: No Shortness of breath?: No  Endocrine Excessive thirst?: No  Musculoskeletal Back pain?: Yes Joint pain?: No  Neurological Headaches?: No Dizziness?: No  Psychologic Depression?: No Anxiety?: No  Physical Exam: BP (!) 142/85   Pulse (!) 106   Ht 5\' 10"  (1.778 m)   Wt 253 lb (114.8 kg)   BMI 36.30 kg/m   Constitutional:  Alert and oriented, No acute distress. HEENT:  AT, moist mucus membranes.  Trachea midline, no masses. Cardiovascular: No clubbing, cyanosis, or edema. Respiratory: Normal respiratory effort, no increased work of breathing. Skin: No rashes, bruises or suspicious lesions. Neurologic: Grossly intact, no focal deficits, moving all 4 extremities. Psychiatric: Normal mood and affect.  Laboratory Data: Component     Latest Ref Rng & Units 02/02/2016 08/02/2016 09/06/2017 05/10/2018  Prostate Specific Ag, Serum     0.0 - 4.0 ng/mL 14.6 (H) 8.7 (H) 9.2 (H) 4.3 (H)    Pertinent Imaging: CLINICAL DATA:  Rising PSA level. Previous negative prostate  biopsy.  Creatinine was obtained on site at [facility] at [address]  Results: Creatinine 0.9 mg/dL.  EXAM: MR PROSTATE WITHOUT AND WITH CONTRAST  TECHNIQUE: Multiplanar multisequence MRI images were obtained of the pelvis centered about the prostate. Pre and post contrast images were obtained.  CONTRAST:  80mL MULTIHANCE GADOBENATE DIMEGLUMINE 529 MG/ML IV SOLN  COMPARISON:  None.  FINDINGS: Prostate:  -- Peripheral Zone: Diffuse thinning noted. No abnormality seen on ADC and high b-value DWI sequences.  -- Transition/Central Zone: Circumscribed BPH nodules noted, but no suspicious nodules with obscured or non-circumscribed margins seen.  -- Volume:  6.2 x 4.5 x 5.4 cm (volume = 79 cm^3)  Transcapsular spread:  Absent  Seminal vesicle involvement:  Absent  Neurovascular bundle involvement:  Absent  Pelvic adenopathy: None visualized  Bone metastasis: None visualized  Other: Mild  diverticulosis of sigmoid colon, without evidence of diverticulitis.  IMPRESSION: No radiographic evidence of high-grade prostate carcinoma. PI-RADS 1: Very Low (clinically significant cancer is highly unlikely to be present)   Electronically Signed   By: Myles RosenthalJohn  Stahl M.D.   On: 01/01/2018 14:00  I have personally reviewed the images and agree with radiologist interpretation.   Assessment & Plan:    1. Elevated PSA PSA is 4.3 which decreased from previous PSA of 9.2  Prostate MRI showed no suspicious lesions, very reassuring  Asymptomatic   2. Benign prostatic hyperplasia without lower urinary tract symptoms Currently on finasteride primarily for chemoprevention but is also improved his stream and nocturia  F/u in 1 year with PSA prior and DRE  Return in about 1 year (around 05/30/2019) for PSA prior and DRE .  [facility] HospitalNethusan Sivanesan  [facility] [address], [address] [place], [place] [address] [phone]  [phone]  I, Donne Hazelethusan Sivanesan, am acting as a scribe for Dr. Vanna ScotlandAshley Rachelle Edwards,  I have reviewed the above documentation for accuracy and completeness, and I agree with the above.   Vanna ScotlandAshley Tawanda Schall, MD

## 2018-05-29 ENCOUNTER — Other Ambulatory Visit: Payer: Self-pay

## 2018-05-29 ENCOUNTER — Ambulatory Visit: Payer: Managed Care, Other (non HMO) | Admitting: Urology

## 2018-05-29 ENCOUNTER — Encounter: Payer: Self-pay | Admitting: Urology

## 2018-05-29 VITALS — BP 142/85 | HR 106 | Ht 70.0 in | Wt 253.0 lb

## 2018-05-29 DIAGNOSIS — R972 Elevated prostate specific antigen [PSA]: Secondary | ICD-10-CM | POA: Diagnosis not present

## 2018-05-29 DIAGNOSIS — N400 Enlarged prostate without lower urinary tract symptoms: Secondary | ICD-10-CM

## 2018-05-30 ENCOUNTER — Encounter: Payer: Self-pay | Admitting: Family Medicine

## 2018-05-30 ENCOUNTER — Other Ambulatory Visit: Payer: Self-pay | Admitting: Family Medicine

## 2018-05-30 DIAGNOSIS — E119 Type 2 diabetes mellitus without complications: Secondary | ICD-10-CM | POA: Insufficient documentation

## 2018-05-30 MED ORDER — GLUCOSE BLOOD VI STRP: ORAL_STRIP | 4 refills | Status: DC

## 2018-05-30 MED ORDER — ACCU-CHEK SOFT TOUCH LANCETS MISC: 4 refills | Status: AC

## 2018-05-30 NOTE — Telephone Encounter (Signed)
Please advise. These prescriptions weren't listed in his chart, so I added an order for them in case you agreed to refill them.

## 2018-05-30 NOTE — Telephone Encounter (Signed)
Pt needs refill on strips and lancets soft click pens  He uses [facility]  [facility]  CB#  [phone]  Barth Kirks

## 2018-06-14 ENCOUNTER — Other Ambulatory Visit: Payer: Self-pay | Admitting: Family Medicine

## 2018-06-14 DIAGNOSIS — M4802 Spinal stenosis, cervical region: Secondary | ICD-10-CM

## 2018-07-03 ENCOUNTER — Other Ambulatory Visit: Payer: Self-pay | Admitting: Family Medicine

## 2018-07-03 DIAGNOSIS — M4802 Spinal stenosis, cervical region: Secondary | ICD-10-CM

## 2018-08-07 ENCOUNTER — Other Ambulatory Visit: Payer: Self-pay | Admitting: Family Medicine

## 2018-08-07 DIAGNOSIS — M4802 Spinal stenosis, cervical region: Secondary | ICD-10-CM

## 2018-08-09 ENCOUNTER — Encounter: Payer: Self-pay | Admitting: Family Medicine

## 2018-08-09 ENCOUNTER — Ambulatory Visit: Payer: Managed Care, Other (non HMO) | Admitting: Family Medicine

## 2018-08-09 ENCOUNTER — Other Ambulatory Visit: Payer: Self-pay

## 2018-08-09 VITALS — BP 120/78 | HR 89 | Temp 98.2°F | Ht 70.0 in | Wt 243.8 lb

## 2018-08-09 DIAGNOSIS — Z0000 Encounter for general adult medical examination without abnormal findings: Secondary | ICD-10-CM | POA: Diagnosis not present

## 2018-08-09 DIAGNOSIS — E119 Type 2 diabetes mellitus without complications: Secondary | ICD-10-CM

## 2018-08-09 DIAGNOSIS — N400 Enlarged prostate without lower urinary tract symptoms: Secondary | ICD-10-CM

## 2018-08-09 DIAGNOSIS — I10 Essential (primary) hypertension: Secondary | ICD-10-CM | POA: Diagnosis not present

## 2018-08-09 DIAGNOSIS — E782 Mixed hyperlipidemia: Secondary | ICD-10-CM

## 2018-08-09 LAB — POCT GLYCOSYLATED HEMOGLOBIN (HGB A1C): Hemoglobin A1C: 5.9 % — AB (ref 4.0–5.6)

## 2018-08-09 NOTE — Progress Notes (Signed)
Patient: Calvin Horton Male    DOB: Sep 07, 1960   58 y.o.   MRN: [id number] Visit Date: 08/09/2018  Today's Provider: Dortha Kernennis Chrismon, PA   Chief Complaint  Patient presents with  . Diabetes    3 month fup   Subjective:     HPI   Diabetes Mellitus Type II, Follow-up:   Lab Results  Component Value Date   HGBA1C 5.9 (A) 08/09/2018   HGBA1C 10.0 (H) 04/23/2018   HGBA1C 6.6 (H) 02/02/2016    Last seen for diabetes 3 months ago.  Management since then includes started glipizide. He reports excellent compliance with treatment. He is not having side effects.  Current symptoms include none and have been improving. Home blood sugar records: fasting range: 80 to 135  Episodes of hypoglycemia? yes - a few   Current Insulin Regimen: none Most Recent Eye Exam: 1 yr ago 2019 Weight trend: decreasing steadily Prior visit with dietician: No Current exercise: walking Current diet habits: in general, a "healthy" diet    Pertinent Labs:    Component Value Date/Time   CHOL 206 (H) 04/23/2018 0940   TRIG 180 (H) 04/23/2018 0940   HDL 65 04/23/2018 0940   LDLCALC 105 (H) 04/23/2018 0940   CREATININE 0.99 04/23/2018 0940    Wt Readings from Last 3 Encounters:  08/09/18 243 lb 12.8 oz (110.6 kg)  05/29/18 253 lb (114.8 kg)  05/10/18 258 lb 6.4 oz (117.2 kg)   ---------------------------------------------------------------------  Past Medical History:  Diagnosis Date  . Allergy   . Elevated PSA   . GERD (gastroesophageal reflux disease)   . Hypertension    Past Surgical History:  Procedure Laterality Date  . CHOLECYSTECTOMY    . NECK SURGERY  2016   4-5-6 vertabae on right and 4-5 on left  . TONSILLECTOMY     Family History  Problem Relation Age of Onset  . Diabetes Mother   . Lumbar disc disease Father   . Prostate cancer Father   . Healthy Daughter   . Healthy Son   . Diabetes Maternal Grandmother   . Diabetes Paternal Grandmother    Allergies   Allergen Reactions  . Codeine   . Chlorhexidine Itching and Rash    Current Outpatient Medications:  .  albuterol (PROVENTIL HFA;VENTOLIN HFA) 108 (90 Base) MCG/ACT inhaler, Inhale 2 puffs into the lungs every 6 (six) hours as needed for wheezing or shortness of breath (Patient states he only takes if he has a URI)., Disp: , Rfl:  .  celecoxib (CELEBREX) 200 MG capsule, TAKE 1 CAPSULE BY MOUTH TWICE A DAY, Disp: 60 capsule, Rfl: 1 .  cetirizine (ZYRTEC) 10 MG tablet, Take 1 tablet by mouth daily., Disp: , Rfl:  .  esomeprazole (NEXIUM) 40 MG capsule, TAKE 1 CAPSULE BY MOUTH EVERY DAY, Disp: 90 capsule, Rfl: 4 .  finasteride (PROSCAR) 5 MG tablet, Take 1 tablet (5 mg total) by mouth daily., Disp: 90 tablet, Rfl: 3 .  gabapentin (NEURONTIN) 600 MG tablet, TAKE ONE TABLET BY MOUTH DAILY AS NEEDED FOR NECK PAIN., Disp: 90 tablet, Rfl: 1 .  glipiZIDE (GLUCOTROL) 5 MG tablet, Take 1 tablet (5 mg total) by mouth daily before breakfast., Disp: 30 tablet, Rfl: 3 .  glucose blood test strip, Test fasting blood sugar every morning. DX: DM without complications-E11.9, Disp: 100 each, Rfl: 4 .  Lancets (ACCU-CHEK SOFT TOUCH) lancets, Test fasting blood sugar every morning. DX: DM without complications-E11.9, Disp: 100 each,  Rfl: 4 .  lisinopril-hydrochlorothiazide (PRINZIDE,ZESTORETIC) 20-25 MG tablet, Take 1 tablet by mouth daily., Disp: 90 tablet, Rfl: 3 .  POTASSIUM CHLORIDE PO, Take by mouth. Takes OTC potassium, Disp: , Rfl:   Review of Systems  All other systems reviewed and are negative.  Social History   Tobacco Use  . Smoking status: Never Smoker  . Smokeless tobacco: Current User    Types: Chew  Substance Use Topics  . Alcohol use: Yes    Alcohol/week: 0.0 standard drinks    Comment: occasionally     Objective:   BP 120/78 (BP Location: Right Arm, Patient Position: Sitting, Cuff Size: Normal)   Pulse 89   Temp 98.2 F (36.8 C) (Oral)   Ht  (1.778 m)   Wt 243 lb 12.8 oz  (110.6 kg)   SpO2 97%   BMI 34.98 kg/m    BP Readings from Last 3 Encounters:  08/09/18 120/78  05/29/18 (!) 142/85  05/10/18 (!) 142/82   Vitals:   08/09/18 0825  BP: 120/78  Pulse: 89  Temp: 98.2 F (36.8 C)  TempSrc: Oral  SpO2: 97%  Weight: 243 lb 12.8 oz (110.6 kg)  Height:  (1.778 m)   Physical Exam Constitutional:      Appearance: He is well-developed.  HENT:     Head: Normocephalic and atraumatic.     Right Ear: External ear normal.     Left Ear: External ear normal.     Nose: Nose normal.  Eyes:     General:        Right eye: No discharge.     Conjunctiva/sclera: Conjunctivae normal.     Pupils: Pupils are equal, round, and reactive to light.  Neck:     Musculoskeletal: Normal range of motion and neck supple.     Thyroid: No thyromegaly.     Trachea: No tracheal deviation.  Cardiovascular:     Rate and Rhythm: Normal rate and regular rhythm.     Heart sounds: Normal heart sounds. No murmur.  Pulmonary:     Effort: Pulmonary effort is normal. No respiratory distress.     Breath sounds: Normal breath sounds. No wheezing or rales.  Chest:     Chest wall: No tenderness.  Abdominal:     General: There is no distension.     Palpations: Abdomen is soft. There is no mass.     Tenderness: There is no abdominal tenderness. There is no guarding or rebound.  Genitourinary:    Comments: Followed by urologist (Dr. Apolinar Junes) with last PSA 4.3 three months ago. Asymptomatic. Musculoskeletal: Normal range of motion.        General: No tenderness.  Lymphadenopathy:     Cervical: No cervical adenopathy.  Skin:    General: Skin is warm and dry.     Findings: No erythema or rash.  Neurological:     Mental Status: He is alert and oriented to person, place, and time.     Cranial Nerves: No cranial nerve deficit.     Motor: No abnormal muscle tone.     Coordination: Coordination normal.     Deep Tendon Reflexes: Reflexes are normal and symmetric. Reflexes normal.   Psychiatric:        Behavior: Behavior normal.        Thought Content: Thought content normal.        Judgment: Judgment normal.       Assessment & Plan    1. Annual physical exam General health  improved with weight loss. Due for tetanus up date with pneumonia vaccination at next appointment and unsure if colonoscopy accomplished in 2019.  - CBC with Differential/Platelet - Comprehensive metabolic panel - Lipid panel  2. Diabetes mellitus without complication (HCC) Has lost 10 lbs since the last office visit and feeling well. No vision changes or peripheral neuropathy. Hgb A1C much better at 5.9% today. Still using Glipizide 5 mg qd with diabetic low fat diet. Recheck CBC and CMP. Encouraged to get ophthalmology exam annually and check FBS daily to monitor control. Will get foot exam with urine microalbumen at his next appointment for diabetes follow up. - POCT glycosylated hemoglobin (Hb A1C) - CBC with Differential/Platelet - Comprehensive metabolic panel  3. Essential hypertension Well controlled BP with Prinzide 20-25 mg qd. No chest pains, dyspnea, palpitations or peripheral edema. Continue to restrict salt intake and caffeine consumption. Recheck labs and follow up pending reports. - CBC with Differential/Platelet - Comprehensive metabolic panel  4. Benign prostatic hyperplasia without lower urinary tract symptoms Followed by Dr. Apolinar Junes (urologist) with decrease in PSA to 4.3 on 04-23-18. Still taking Finasteride 5 mg qd without side effects. No significant nocturia, frequency or decrease in stream. Continue present dosage and follow up with urologist as planned. Recheck renal function with CMP. - Comprehensive metabolic panel  5. Mixed hyperlipidemia Total cholesterol 206, triglycerides 180, HDL 65 and LDL 105 on 04-23-18. Has been trying to lower fats in diet and lost 10 lbs since 05-29-18. Will recheck CMP and Lipid Panel. Continue diet plan and exercise regularly. -  Comprehensive metabolic panel - Lipid panel     Dortha Kern, PA  [facility]

## 2018-08-10 LAB — CBC WITH DIFFERENTIAL/PLATELET
Basophils Absolute: 0 10*3/uL (ref 0.0–0.2)
Basos: 1 %
EOS (ABSOLUTE): 0.1 10*3/uL (ref 0.0–0.4)
Eos: 1 %
Hematocrit: 46.1 % (ref 37.5–51.0)
Hemoglobin: 15.5 g/dL (ref 13.0–17.7)
Immature Grans (Abs): 0 10*3/uL (ref 0.0–0.1)
Immature Granulocytes: 0 %
Lymphocytes Absolute: 2 10*3/uL (ref 0.7–3.1)
Lymphs: 23 %
MCH: 32 pg (ref 26.6–33.0)
MCHC: 33.6 g/dL (ref 31.5–35.7)
MCV: 95 fL (ref 79–97)
Monocytes Absolute: 0.6 10*3/uL (ref 0.1–0.9)
Monocytes: 7 %
Neutrophils Absolute: 6 10*3/uL (ref 1.4–7.0)
Neutrophils: 68 %
Platelets: 238 10*3/uL (ref 150–450)
RBC: 4.85 x10E6/uL (ref 4.14–5.80)
RDW: 13.3 % (ref 11.6–15.4)
WBC: 8.9 10*3/uL (ref 3.4–10.8)

## 2018-08-10 LAB — COMPREHENSIVE METABOLIC PANEL
ALT: 22 IU/L (ref 0–44)
AST: 24 IU/L (ref 0–40)
Albumin/Globulin Ratio: 2.1 (ref 1.2–2.2)
Albumin: 4.5 g/dL (ref 3.8–4.9)
Alkaline Phosphatase: 67 IU/L (ref 39–117)
BUN/Creatinine Ratio: 10 (ref 9–20)
BUN: 10 mg/dL (ref 6–24)
Bilirubin Total: 1 mg/dL (ref 0.0–1.2)
CO2: 22 mmol/L (ref 20–29)
Calcium: 9.7 mg/dL (ref 8.7–10.2)
Chloride: 98 mmol/L (ref 96–106)
Creatinine, Ser: 0.97 mg/dL (ref 0.76–1.27)
GFR calc Af Amer: 100 mL/min/{1.73_m2} (ref >59)
GFR calc non Af Amer: 87 mL/min/{1.73_m2} (ref >59)
Globulin, Total: 2.1 g/dL (ref 1.5–4.5)
Glucose: 104 mg/dL — ABNORMAL HIGH (ref 65–99)
Potassium: 3.8 mmol/L (ref 3.5–5.2)
Sodium: 139 mmol/L (ref 134–144)
Total Protein: 6.6 g/dL (ref 6.0–8.5)

## 2018-08-10 LAB — LIPID PANEL
Chol/HDL Ratio: 3.1 ratio (ref 0.0–5.0)
Cholesterol, Total: 210 mg/dL — ABNORMAL HIGH (ref 100–199)
HDL: 67 mg/dL (ref >39)
LDL Calculated: 125 mg/dL — ABNORMAL HIGH (ref 0–99)
Triglycerides: 90 mg/dL (ref 0–149)
VLDL Cholesterol Cal: 18 mg/dL (ref 5–40)

## 2018-08-13 ENCOUNTER — Other Ambulatory Visit: Payer: Self-pay

## 2018-08-13 DIAGNOSIS — E1169 Type 2 diabetes mellitus with other specified complication: Secondary | ICD-10-CM

## 2018-08-13 DIAGNOSIS — E785 Hyperlipidemia, unspecified: Secondary | ICD-10-CM

## 2018-08-13 MED ORDER — ATORVASTATIN CALCIUM 20 MG PO TABS: 20.0000 mg | ORAL_TABLET | Freq: Every day | ORAL | 3 refills | Status: DC

## 2018-08-16 ENCOUNTER — Other Ambulatory Visit: Payer: Self-pay | Admitting: Family Medicine

## 2018-08-16 DIAGNOSIS — E119 Type 2 diabetes mellitus without complications: Secondary | ICD-10-CM

## 2018-10-06 ENCOUNTER — Other Ambulatory Visit: Payer: Self-pay | Admitting: Family Medicine

## 2018-10-06 DIAGNOSIS — M4802 Spinal stenosis, cervical region: Secondary | ICD-10-CM

## 2018-11-12 ENCOUNTER — Other Ambulatory Visit: Payer: Self-pay

## 2018-11-12 ENCOUNTER — Encounter: Payer: Self-pay | Admitting: Family Medicine

## 2018-11-12 ENCOUNTER — Ambulatory Visit: Payer: Managed Care, Other (non HMO) | Admitting: Family Medicine

## 2018-11-12 VITALS — BP 124/84 | HR 74 | Temp 98.0°F | Resp 16 | Ht 70.0 in | Wt 250.0 lb

## 2018-11-12 DIAGNOSIS — B351 Tinea unguium: Secondary | ICD-10-CM

## 2018-11-12 DIAGNOSIS — E1169 Type 2 diabetes mellitus with other specified complication: Secondary | ICD-10-CM

## 2018-11-12 DIAGNOSIS — E785 Hyperlipidemia, unspecified: Secondary | ICD-10-CM

## 2018-11-12 DIAGNOSIS — Z23 Encounter for immunization: Secondary | ICD-10-CM

## 2018-11-12 NOTE — Progress Notes (Signed)
Patient: Calvin Horton Male    DOB: 1960/05/19   58 y.o.   MRN: [id number] Visit Date: 11/12/2018  Today's Provider: Dortha Kernennis , PA   Chief Complaint  Patient presents with  . Follow-up   Subjective:     HPI  Follow up for cholesterol medication  The patient was last seen for this 3 months ago. Changes made at last visit include starting Atorvastatin 20 mg daily .  He reports excellent compliance with treatment. He feels that condition is Unchanged. He is not having side effects.   ------------------------------------------------------------------------------------  Patient c/o toe nail fungus on both feet.   Past Medical History:  Diagnosis Date  . Allergy   . Elevated PSA   . GERD (gastroesophageal reflux disease)   . Hypertension    Past Surgical History:  Procedure Laterality Date  . CHOLECYSTECTOMY    . NECK SURGERY  2016   4-5-6 vertabae on right and 4-5 on left  . TONSILLECTOMY     Family History  Problem Relation Age of Onset  . Diabetes Mother   . Lumbar disc disease Father   . Prostate cancer Father   . Healthy Daughter   . Healthy Son   . Diabetes Maternal Grandmother   . Diabetes Paternal Grandmother    Allergies  Allergen Reactions  . Codeine   . Chlorhexidine Itching and Rash    Current Outpatient Medications:  .  albuterol (PROVENTIL HFA;VENTOLIN HFA) 108 (90 Base) MCG/ACT inhaler, Inhale 2 puffs into the lungs every 6 (six) hours as needed for wheezing or shortness of breath (Patient states he only takes if he has a URI)., Disp: , Rfl:  .  atorvastatin (LIPITOR) 20 MG tablet, Take 1 tablet (20 mg total) by mouth daily., Disp: 90 tablet, Rfl: 3 .  celecoxib (CELEBREX) 200 MG capsule, TAKE 1 CAPSULE BY MOUTH TWICE A DAY, Disp: 60 capsule, Rfl: 1 .  cetirizine (ZYRTEC) 10 MG tablet, Take 1 tablet by mouth daily., Disp: , Rfl:  .  esomeprazole (NEXIUM) 40 MG capsule, TAKE 1 CAPSULE BY MOUTH EVERY DAY, Disp: 90 capsule,  Rfl: 4 .  finasteride (PROSCAR) 5 MG tablet, Take 1 tablet (5 mg total) by mouth daily., Disp: 90 tablet, Rfl: 3 .  gabapentin (NEURONTIN) 600 MG tablet, TAKE ONE TABLET BY MOUTH DAILY AS NEEDED FOR NECK PAIN., Disp: 90 tablet, Rfl: 1 .  glipiZIDE (GLUCOTROL) 5 MG tablet, TAKE 1 TABLET (5 MG TOTAL) BY MOUTH DAILY BEFORE BREAKFAST., Disp: 90 tablet, Rfl: 1 .  glucose blood test strip, Test fasting blood sugar every morning. DX: DM without complications-E11.9, Disp: 100 each, Rfl: 4 .  Lancets (ACCU-CHEK SOFT TOUCH) lancets, Test fasting blood sugar every morning. DX: DM without complications-E11.9, Disp: 100 each, Rfl: 4 .  lisinopril-hydrochlorothiazide (PRINZIDE,ZESTORETIC) 20-25 MG tablet, Take 1 tablet by mouth daily., Disp: 90 tablet, Rfl: 3 .  POTASSIUM CHLORIDE PO, Take by mouth. Takes OTC potassium, Disp: , Rfl:   Review of Systems  Constitutional: Negative.   Cardiovascular: Negative.   Endocrine: Negative.   Skin: Positive for color change.   Social History   Tobacco Use  . Smoking status: Never Smoker  . Smokeless tobacco: Current User    Types: Chew  Substance Use Topics  . Alcohol use: Yes    Alcohol/week: 0.0 standard drinks    Comment: occasionally     Objective:   BP 124/84 (BP Location: Right Arm, Patient Position: Sitting, Cuff Size: Normal)  Pulse 74   Temp 98 F (36.7 C) (Oral)   Resp 16   Ht 5\' 10"  (1.778 m)   Wt 250 lb (113.4 kg)   SpO2 95%   BMI 35.87 kg/m  Vitals:   11/12/18 0826  BP: 124/84  Pulse: 74  Resp: 16  Temp: 98 F (36.7 C)  TempSrc: Oral  SpO2: 95%  Weight: 250 lb (113.4 kg)  Height: 5\' 10"  (1.778 m)   Wt Readings from Last 3 Encounters:  11/12/18 250 lb (113.4 kg)  08/09/18 243 lb 12.8 oz (110.6 kg)  05/29/18 253 lb (114.8 kg)   Physical Exam Constitutional:      General: He is not in acute distress.    Appearance: He is well-developed.  HENT:     Head: Normocephalic and atraumatic.     Right Ear: Hearing and tympanic  membrane normal.     Left Ear: Hearing and tympanic membrane normal.     Nose: Nose normal.     Mouth/Throat:     Pharynx: Oropharynx is clear.  Eyes:     General: Lids are normal. No scleral icterus.       Right eye: No discharge.        Left eye: No discharge.     Conjunctiva/sclera: Conjunctivae normal.  Neck:     Musculoskeletal: Normal range of motion and neck supple.  Cardiovascular:     Rate and Rhythm: Normal rate and regular rhythm.     Pulses: Normal pulses.     Heart sounds: Normal heart sounds.  Pulmonary:     Effort: Pulmonary effort is normal. No respiratory distress.  Abdominal:     General: Bowel sounds are normal.     Palpations: Abdomen is soft.  Musculoskeletal: Normal range of motion.     Comments: Toenails thick but well trimmed. No sign of infection.  Skin:    Findings: No lesion or rash.  Neurological:     Mental Status: He is alert and oriented to person, place, and time.  Psychiatric:        Speech: Speech normal.        Behavior: Behavior normal.        Thought Content: Thought content normal.    Diabetic Foot Form - Detailed   Diabetic Foot Exam - detailed Diabetic Foot exam was performed with the following findings: Yes 11/12/2018  8:49 AM  Visual Foot Exam completed.: Yes  Can the patient see the bottom of their feet?: Yes Are the shoes appropriate in style and fit?: Yes Is there swelling or and abnormal foot shape?: No Is there a claw toe deformity?: No Is there elevated skin temparature?: No Is there foot or ankle muscle weakness?: No Normal Range of Motion: Yes Pulse Foot Exam completed.: Yes  Right posterior Tibialias: Present Left posterior Tibialias: Present  Right Dorsalis Pedis: Present Left Dorsalis Pedis: Present  Sensory Foot Exam Completed.: Yes Semmes-Weinstein Monofilament Test R Site 1-Great Toe: Pos L Site 1-Great Toe: Pos    Comments: Occasional tingling pains well controlled by gabapentin.        Assessment & Plan     1. Hyperlipidemia associated with type 2 diabetes mellitus ([place]) Three months ago Hgb A1C was 5.9 with LDL up to 125. Started Atorvastatin 20 mg qd. Tolerating this medication without muscle or joint pains. Will recheck CMP and Lipid Panel. Must continue to work on weight loss. Admits some indiscretions in eating habits. BS averages around 95-105 per glucometer. Recheck pending lab  reports. - Comprehensive metabolic panel - Lipid panel  2. Onychomycosis Asymptomatic thickened yellowish nails of most toes each foot. Trimmed well. May use antifungal treatment at home (topical) and recheck in 3-4 months. May need referral to podiatrist if any worsening.  3. Need for prophylactic vaccination against diphtheria and tetanus - Td : Tetanus/diphtheria >7yo Preservative  free      Dortha Kernennis , PA  [facility]

## 2018-11-13 LAB — LIPID PANEL
Chol/HDL Ratio: 1.9 ratio (ref 0.0–5.0)
Cholesterol, Total: 137 mg/dL (ref 100–199)
HDL: 71 mg/dL (ref >39)
LDL Calculated: 55 mg/dL (ref 0–99)
Triglycerides: 54 mg/dL (ref 0–149)
VLDL Cholesterol Cal: 11 mg/dL (ref 5–40)

## 2018-11-13 LAB — COMPREHENSIVE METABOLIC PANEL
ALT: 29 IU/L (ref 0–44)
AST: 29 IU/L (ref 0–40)
Albumin/Globulin Ratio: 2 (ref 1.2–2.2)
Albumin: 4.2 g/dL (ref 3.8–4.9)
Alkaline Phosphatase: 68 IU/L (ref 39–117)
BUN/Creatinine Ratio: 15 (ref 9–20)
BUN: 13 mg/dL (ref 6–24)
Bilirubin Total: 1 mg/dL (ref 0.0–1.2)
CO2: 27 mmol/L (ref 20–29)
Calcium: 9.5 mg/dL (ref 8.7–10.2)
Chloride: 100 mmol/L (ref 96–106)
Creatinine, Ser: 0.89 mg/dL (ref 0.76–1.27)
GFR calc Af Amer: 110 mL/min/{1.73_m2} (ref >59)
GFR calc non Af Amer: 95 mL/min/{1.73_m2} (ref >59)
Globulin, Total: 2.1 g/dL (ref 1.5–4.5)
Glucose: 114 mg/dL — ABNORMAL HIGH (ref 65–99)
Potassium: 4.2 mmol/L (ref 3.5–5.2)
Sodium: 140 mmol/L (ref 134–144)
Total Protein: 6.3 g/dL (ref 6.0–8.5)

## 2018-11-15 ENCOUNTER — Telehealth: Payer: Self-pay

## 2018-11-15 NOTE — Telephone Encounter (Signed)
-----   Message from Margo Common, [place] sent at 11/14/2018  4:40 PM EDT ----- All blood work in very good shape. Continue present medications and recheck Hgb A1C in 3 months this time.

## 2018-11-15 NOTE — Telephone Encounter (Signed)
Patient notified of results, follow up appointment scheduled.

## 2019-01-06 ENCOUNTER — Other Ambulatory Visit: Payer: Self-pay

## 2019-01-06 DIAGNOSIS — Z20822 Contact with and (suspected) exposure to covid-19: Secondary | ICD-10-CM

## 2019-01-08 ENCOUNTER — Other Ambulatory Visit: Payer: Self-pay | Admitting: Family Medicine

## 2019-01-08 DIAGNOSIS — M4802 Spinal stenosis, cervical region: Secondary | ICD-10-CM

## 2019-01-08 LAB — NOVEL CORONAVIRUS, NAA: SARS-CoV-2, NAA: NOT DETECTED

## 2019-02-25 ENCOUNTER — Encounter: Payer: Self-pay | Admitting: Family Medicine

## 2019-02-25 ENCOUNTER — Ambulatory Visit: Payer: Managed Care, Other (non HMO) | Admitting: Family Medicine

## 2019-02-25 ENCOUNTER — Other Ambulatory Visit: Payer: Self-pay

## 2019-02-25 VITALS — BP 138/82 | HR 84 | Temp 97.1°F | Wt 237.0 lb

## 2019-02-25 DIAGNOSIS — I10 Essential (primary) hypertension: Secondary | ICD-10-CM | POA: Diagnosis not present

## 2019-02-25 DIAGNOSIS — N400 Enlarged prostate without lower urinary tract symptoms: Secondary | ICD-10-CM

## 2019-02-25 DIAGNOSIS — E119 Type 2 diabetes mellitus without complications: Secondary | ICD-10-CM

## 2019-02-25 DIAGNOSIS — E782 Mixed hyperlipidemia: Secondary | ICD-10-CM | POA: Diagnosis not present

## 2019-02-25 DIAGNOSIS — M4802 Spinal stenosis, cervical region: Secondary | ICD-10-CM

## 2019-02-25 MED ORDER — GLIPIZIDE 5 MG PO TABS: 5.0000 mg | ORAL_TABLET | Freq: Every day | ORAL | 3 refills | Status: DC

## 2019-02-25 MED ORDER — FINASTERIDE 5 MG PO TABS: 5.0000 mg | ORAL_TABLET | Freq: Every day | ORAL | 3 refills | Status: DC

## 2019-02-25 MED ORDER — CELECOXIB 200 MG PO CAPS: 200.0000 mg | ORAL_CAPSULE | Freq: Two times a day (BID) | ORAL | 3 refills | Status: DC

## 2019-02-25 MED ORDER — LISINOPRIL-HYDROCHLOROTHIAZIDE 20-25 MG PO TABS: 1.0000 | ORAL_TABLET | Freq: Every day | ORAL | 3 refills | Status: DC

## 2019-02-25 NOTE — Progress Notes (Signed)
Calvin Horton  MRN: [id number] DOB: Apr 14, 1960  Subjective:  HPI   Diabetes- Date of last visit 11/12/18 Date and reading of last A1C 5.9 Home glucose readings range 60-122 Any signs or symptoms of hypoglycemia-no Is the patient checking their feet daily?  Is the patient seeing an ophthalmologist on a yearly basis?  When was the last visit to the ophthalmologist?   Hypertension- Date of last visit 11/12/18 Home blood pressure readings 120's over 80's Last blood pressure readings  BP Readings from Last 3 Encounters:  02/25/19 138/82  11/12/18 124/84  08/09/18 120/78     Patient Active Problem List   Diagnosis Date Noted  . BPH (benign prostatic hyperplasia) 08/09/2018  . Diabetes mellitus without complication (HCC) 05/30/2018  . Arthritis 05/10/2018  . High blood pressure disorder 05/10/2018  . GERD (gastroesophageal reflux disease) 08/02/2016  . Spinal stenosis in cervical region 05/19/2014    Past Medical History:  Diagnosis Date  . Allergy   . Elevated PSA   . GERD (gastroesophageal reflux disease)   . Hypertension     Social History   Socioeconomic History  . Marital status: Married    Spouse name: Not on file  . Number of children: Not on file  . Years of education: Not on file  . Highest education level: Not on file  Occupational History  . Not on file  Social Needs  . Financial resource strain: Not on file  . Food insecurity    Worry: Not on file    Inability: Not on file  . Transportation needs    Medical: Not on file    Non-medical: Not on file  Tobacco Use  . Smoking status: Never Smoker  . Smokeless tobacco: Current User    Types: Chew  Substance and Sexual Activity  . Alcohol use: Yes    Alcohol/week: 0.0 standard drinks    Comment: occasionally  . Drug use: No  . Sexual activity: Not on file  Lifestyle  . Physical activity    Days per week: Not on file    Minutes per session: Not on file  . Stress: Not on file   Relationships  . Social Musicianconnections    Talks on phone: Not on file    Gets together: Not on file    Attends religious service: Not on file    Active member of club or organization: Not on file    Attends meetings of clubs or organizations: Not on file    Relationship status: Not on file  . Intimate partner violence    Fear of current or ex partner: Not on file    Emotionally abused: Not on file    Physically abused: Not on file    Forced sexual activity: Not on file  Other Topics Concern  . Not on file  Social History Narrative  . Not on file    Outpatient Encounter Medications as of 02/25/2019  Medication Sig  . albuterol (PROVENTIL HFA;VENTOLIN HFA) 108 (90 Base) MCG/ACT inhaler Inhale 2 puffs into the lungs every 6 (six) hours as needed for wheezing or shortness of breath (Patient states he only takes if he has a URI).  Marland Kitchen. atorvastatin (LIPITOR) 20 MG tablet Take 1 tablet (20 mg total) by mouth daily.  . celecoxib (CELEBREX) 200 MG capsule TAKE 1 CAPSULE BY MOUTH TWICE A DAY  . cetirizine (ZYRTEC) 10 MG tablet Take 1 tablet by mouth daily.  Marland Kitchen. esomeprazole (NEXIUM) 40 MG capsule TAKE 1 CAPSULE BY  MOUTH EVERY DAY  . finasteride (PROSCAR) 5 MG tablet Take 1 tablet (5 mg total) by mouth daily.  Marland Kitchen gabapentin (NEURONTIN) 600 MG tablet TAKE 1 TABLET BY MOUTH DAILY AS NEEDED FOR NECK PAIN.  . glipiZIDE (GLUCOTROL) 5 MG tablet TAKE 1 TABLET (5 MG TOTAL) BY MOUTH DAILY BEFORE BREAKFAST.  Marland Kitchen glucose blood test strip Test fasting blood sugar every morning. DX: DM without complications-E11.9  . Lancets (ACCU-CHEK SOFT TOUCH) lancets Test fasting blood sugar every morning. DX: DM without complications-E11.9  . lisinopril-hydrochlorothiazide (PRINZIDE,ZESTORETIC) 20-25 MG tablet Take 1 tablet by mouth daily.  Marland Kitchen POTASSIUM CHLORIDE PO Take by mouth. Takes OTC potassium   No facility-administered encounter medications on file as of 02/25/2019.     Allergies  Allergen Reactions  . Codeine   .  Chlorhexidine Itching and Rash    Review of Systems  Constitutional: Negative for chills, diaphoresis, fever and malaise/fatigue.  HENT: Negative for congestion, ear pain, sinus pain and sore throat.   Respiratory: Negative for cough and shortness of breath.   Cardiovascular: Negative for chest pain, palpitations, claudication and leg swelling.  Gastrointestinal: Negative for abdominal pain and diarrhea.  Genitourinary: Negative for frequency.  Musculoskeletal: Negative for myalgias.  Neurological: Negative for dizziness and headaches.  Endo/Heme/Allergies: Negative for polydipsia.    Objective:   Vitals:   02/25/19 0956 02/25/19 0957  BP: (!) 142/88 138/82  Pulse: 84   Temp: (!) 97.1 F (36.2 C)   TempSrc: Skin   SpO2: 96%   Weight: 237 lb (107.5 kg)    Wt Readings from Last 3 Encounters:  02/25/19 237 lb (107.5 kg)  11/12/18 250 lb (113.4 kg)  08/09/18 243 lb 12.8 oz (110.6 kg)   Physical Exam  Constitutional: He is oriented to person, place, and time and well-developed, well-nourished, and in no distress.  HENT:  Head: Normocephalic.  Eyes: Conjunctivae are normal.  Neck: Neck supple.  Cardiovascular: Normal rate and regular rhythm.  Pulmonary/Chest: Effort normal and breath sounds normal.  Abdominal: Soft. Bowel sounds are normal.  Musculoskeletal: Normal range of motion.  Neurological: He is alert and oriented to person, place, and time.  Skin: No rash noted.  Psychiatric: Mood, affect and judgment normal.    Assessment and Plan :  1. Diabetes mellitus without complication (HCC) FBS in the range of 65-122 and no hypoglycemic symptoms of significance. Following diet well and has lost 13 lbs in the past 3 months.Continue Glipizide 5 mg qd and recheck labs. Scheduled for ophthalmology exam in January 2021. Still on statin and ACE-I. Recheck pending lab reports. - CBC with Differential - Comprehensive Metabolic Panel (CMET) - HgB A1c - Lipid Profile - glipiZIDE  (GLUCOTROL) 5 MG tablet; Take 1 tablet (5 mg total) by mouth daily before breakfast.  Dispense: 90 tablet; Refill: 3  2. Mixed hyperlipidemia Tolerating Atorvastatin 20 mg qd without side effects. Recheck CMP and Lipid Panel. Continue low fat diabetic diet. - Comprehensive Metabolic Panel (CMET) - Lipid Profile  3. Benign prostatic hyperplasia without lower urinary tract symptoms Better urine flow without hesitancy or obstructive symptoms since starting the Finasteride. Last PSA on 05-20-18 was 4.3 (in 2019 it was 9.2). Refill medication and continue regular follow up with Dr. Erlene Quan (urologist) on 05-21-19. Recheck renal function and electrolytes. - Comprehensive Metabolic Panel (CMET) - finasteride (PROSCAR) 5 MG tablet; Take 1 tablet (5 mg total) by mouth daily.  Dispense: 90 tablet; Refill: 3  4. Essential hypertension, benign Fair control of BP today. Continue  Zestorectic and recheck routine labs. Continue to restrict sodium and caffeine. - CBC with Differential - Comprehensive Metabolic Panel (CMET) - Lipid Profile - lisinopril-hydrochlorothiazide (ZESTORETIC) 20-25 MG tablet; Take 1 tablet by mouth daily.  Dispense: 90 tablet; Refill: 3  5. Spinal stenosis in cervical region Stable control of neuropathy with using Celebrex prn and Gabapentin 600 mg qd. No muscle atrophy or weakness. - celecoxib (CELEBREX) 200 MG capsule; Take 1 capsule (200 mg total) by mouth 2 (two) times daily.  Dispense: 60 capsule; Refill: 3

## 2019-02-26 LAB — CBC WITH DIFFERENTIAL/PLATELET
Basophils Absolute: 0.1 10*3/uL (ref 0.0–0.2)
Basos: 1 %
EOS (ABSOLUTE): 0 10*3/uL (ref 0.0–0.4)
Eos: 1 %
Hematocrit: 46.5 % (ref 37.5–51.0)
Hemoglobin: 15.8 g/dL (ref 13.0–17.7)
Immature Grans (Abs): 0 10*3/uL (ref 0.0–0.1)
Immature Granulocytes: 0 %
Lymphocytes Absolute: 1.9 10*3/uL (ref 0.7–3.1)
Lymphs: 23 %
MCH: 32.1 pg (ref 26.6–33.0)
MCHC: 34 g/dL (ref 31.5–35.7)
MCV: 95 fL (ref 79–97)
Monocytes Absolute: 0.5 10*3/uL (ref 0.1–0.9)
Monocytes: 6 %
Neutrophils Absolute: 5.7 10*3/uL (ref 1.4–7.0)
Neutrophils: 69 %
Platelets: 215 10*3/uL (ref 150–450)
RBC: 4.92 x10E6/uL (ref 4.14–5.80)
RDW: 13.2 % (ref 11.6–15.4)
WBC: 8.2 10*3/uL (ref 3.4–10.8)

## 2019-02-26 LAB — COMPREHENSIVE METABOLIC PANEL
ALT: 21 IU/L (ref 0–44)
AST: 23 IU/L (ref 0–40)
Albumin/Globulin Ratio: 2 (ref 1.2–2.2)
Albumin: 4.5 g/dL (ref 3.8–4.9)
Alkaline Phosphatase: 70 IU/L (ref 39–117)
BUN/Creatinine Ratio: 15 (ref 9–20)
BUN: 14 mg/dL (ref 6–24)
Bilirubin Total: 1.6 mg/dL — ABNORMAL HIGH (ref 0.0–1.2)
CO2: 27 mmol/L (ref 20–29)
Calcium: 9.8 mg/dL (ref 8.7–10.2)
Chloride: 98 mmol/L (ref 96–106)
Creatinine, Ser: 0.96 mg/dL (ref 0.76–1.27)
GFR calc Af Amer: 101 mL/min/{1.73_m2} (ref >59)
GFR calc non Af Amer: 87 mL/min/{1.73_m2} (ref >59)
Globulin, Total: 2.2 g/dL (ref 1.5–4.5)
Glucose: 85 mg/dL (ref 65–99)
Potassium: 3.5 mmol/L (ref 3.5–5.2)
Sodium: 142 mmol/L (ref 134–144)
Total Protein: 6.7 g/dL (ref 6.0–8.5)

## 2019-02-26 LAB — LIPID PANEL
Chol/HDL Ratio: 2 ratio (ref 0.0–5.0)
Cholesterol, Total: 154 mg/dL (ref 100–199)
HDL: 76 mg/dL (ref >39)
LDL Chol Calc (NIH): 65 mg/dL (ref 0–99)
Triglycerides: 67 mg/dL (ref 0–149)
VLDL Cholesterol Cal: 13 mg/dL (ref 5–40)

## 2019-02-26 LAB — HEMOGLOBIN A1C
Est. average glucose Bld gHb Est-mCnc: 105 mg/dL
Hgb A1c MFr Bld: 5.3 % (ref 4.8–5.6)

## 2019-03-09 ENCOUNTER — Other Ambulatory Visit: Payer: Self-pay | Admitting: Family Medicine

## 2019-03-09 DIAGNOSIS — I10 Essential (primary) hypertension: Secondary | ICD-10-CM

## 2019-03-09 DIAGNOSIS — J209 Acute bronchitis, unspecified: Secondary | ICD-10-CM

## 2019-03-13 ENCOUNTER — Other Ambulatory Visit: Payer: Self-pay

## 2019-03-13 DIAGNOSIS — Z20822 Contact with and (suspected) exposure to covid-19: Secondary | ICD-10-CM

## 2019-03-15 LAB — NOVEL CORONAVIRUS, NAA: SARS-CoV-2, NAA: NOT DETECTED

## 2019-03-31 ENCOUNTER — Other Ambulatory Visit: Payer: Self-pay | Admitting: Family Medicine

## 2019-03-31 DIAGNOSIS — J209 Acute bronchitis, unspecified: Secondary | ICD-10-CM

## 2019-04-11 LAB — HM DIABETES EYE EXAM

## 2019-05-14 ENCOUNTER — Telehealth: Payer: Self-pay | Admitting: Family Medicine

## 2019-05-14 DIAGNOSIS — M4802 Spinal stenosis, cervical region: Secondary | ICD-10-CM

## 2019-05-14 NOTE — Telephone Encounter (Signed)
Pt called and would like to know if he could increase medication to twice a day. Pt states when he wakes up in the morning he has neck pain. Please advise

## 2019-05-14 NOTE — Telephone Encounter (Signed)
He is on Celebrex BID and Gabapentin daily

## 2019-05-15 MED ORDER — GABAPENTIN 600 MG PO TABS: ORAL_TABLET | ORAL | 1 refills | Status: DC

## 2019-05-15 NOTE — Telephone Encounter (Signed)
Normally on the Gabapentin 600 mg daily. May get a second tablet 12 hours later.

## 2019-05-19 ENCOUNTER — Other Ambulatory Visit: Payer: Self-pay | Admitting: Family Medicine

## 2019-05-19 DIAGNOSIS — J209 Acute bronchitis, unspecified: Secondary | ICD-10-CM

## 2019-05-19 DIAGNOSIS — E785 Hyperlipidemia, unspecified: Secondary | ICD-10-CM

## 2019-05-19 DIAGNOSIS — E1169 Type 2 diabetes mellitus with other specified complication: Secondary | ICD-10-CM

## 2019-05-20 ENCOUNTER — Other Ambulatory Visit: Payer: Self-pay

## 2019-05-20 DIAGNOSIS — R972 Elevated prostate specific antigen [PSA]: Secondary | ICD-10-CM

## 2019-05-20 NOTE — Telephone Encounter (Signed)
Requested Prescriptions  Pending Prescriptions Disp Refills  . atorvastatin (LIPITOR) 20 MG tablet [Pharmacy Med Name: ATORVASTATIN 20 MG TABLET] 90 tablet 3    Sig: TAKE 1 TABLET BY MOUTH EVERY DAY     Cardiovascular:  Antilipid - Statins Passed - 05/19/2019  7:31 PM      Passed - Total Cholesterol in normal range and within 360 days    Cholesterol, Total  Date Value Ref Range Status  02/25/2019 154 100 - 199 mg/dL Final         Passed - LDL in normal range and within 360 days    LDL Chol Calc (NIH)  Date Value Ref Range Status  02/25/2019 65 0 - 99 mg/dL Final         Passed - HDL in normal range and within 360 days    HDL  Date Value Ref Range Status  02/25/2019 76 >39 mg/dL Final         Passed - Triglycerides in normal range and within 360 days    Triglycerides  Date Value Ref Range Status  02/25/2019 67 0 - 149 mg/dL Final         Passed - Patient is not pregnant      Passed - Valid encounter within last 12 months    Recent Outpatient Visits          2 months ago Diabetes mellitus without complication [facility])   [facility] [place], [place], [place]   6 months ago Hyperlipidemia associated with type 2 diabetes mellitus (HCC)   [facility] [facility], [place] C[place], [place]   9 months ago Annual physical exam   [employer], [place] E, [place]   1 year ago Diabetes mellitus without complication [facility])   [employer], [place] Cipro, [place]   1 year ago Encounter for biometric screening   [employer] [facility], [place], [place]      Future Appointments            In 1 week Vanna Scotland, MD [facility]

## 2019-05-21 ENCOUNTER — Other Ambulatory Visit: Payer: Managed Care, Other (non HMO)

## 2019-05-21 ENCOUNTER — Other Ambulatory Visit: Payer: Self-pay

## 2019-05-21 DIAGNOSIS — R972 Elevated prostate specific antigen [PSA]: Secondary | ICD-10-CM

## 2019-05-22 LAB — PSA: Prostate Specific Ag, Serum: 4.2 ng/mL — ABNORMAL HIGH (ref 0.0–4.0)

## 2019-05-28 ENCOUNTER — Encounter: Payer: Self-pay | Admitting: Urology

## 2019-05-28 ENCOUNTER — Ambulatory Visit: Payer: Managed Care, Other (non HMO) | Admitting: Urology

## 2019-05-28 ENCOUNTER — Other Ambulatory Visit: Payer: Self-pay

## 2019-05-28 VITALS — BP 164/99 | HR 92 | Ht 70.0 in | Wt 232.0 lb

## 2019-05-28 DIAGNOSIS — N400 Enlarged prostate without lower urinary tract symptoms: Secondary | ICD-10-CM | POA: Diagnosis not present

## 2019-05-28 DIAGNOSIS — R972 Elevated prostate specific antigen [PSA]: Secondary | ICD-10-CM | POA: Diagnosis not present

## 2019-05-28 NOTE — Progress Notes (Signed)
05/27/2019 11:27 AM   Calvin Horton [birth year] [id number]  Referring provider: Malva Limes, MD [address] [place],  [place] [address]  Chief Complaint  Patient presents with  . Follow-up    HPI: Calvin Horton is a 59 yo M with a hx of BPH and elevated PSA who returns today for a f/u with PSA/DRE.   Elevated PSA He has a personal history of elevated PSA as high as 16.4 as of 02/2010. This remained around this range and ultimately underwent prostate biopsy on 11/17 with a PSA of 14.6 at that time. This revealed acute and focal inflammation, otherwise no malignancy. There was a small focus of atypical glands concerning for carcinoma but not diagnostic for this. TRUS vol at the time was 104 g.   Biopsy complicated by pain for several days and scrotal swelling.He notes that if he has had this in the future, he would like to have this done under anesthesia as it was incredibly uncomfortable.  He denies nocturia and reports of a stronger stream. He is currently taking finasteride which he attributes to the improvement in his urinary symptoms.   His most recent PSA is 4.2 from 05/20/2018. Stable from last time.  He does have a family history of prostate cancer in his father. His father recently died of CHF unrelated to prostate cancer.  No new signicant medical attention. He is retiring from police force.   Component     Latest Ref Rng & Units 02/02/2016 08/02/2016 09/06/2017 05/10/2018  Prostate Specific Ag, Serum     0.0 - 4.0 ng/mL 14.6 (H) 8.7 (H) 9.2 (H) 4.3 (H)   Component     Latest Ref Rng & Units 05/21/2019  Prostate Specific Ag, Serum     0.0 - 4.0 ng/mL 4.2 (H)     PMH: Past Medical History:  Diagnosis Date  . Allergy   . Elevated PSA   . GERD (gastroesophageal reflux disease)   . Hypertension     Surgical History: Past Surgical History:  Procedure Laterality Date  . CHOLECYSTECTOMY    . NECK SURGERY  2016   4-5-6 vertabae  on right and 4-5 on left  . TONSILLECTOMY      Home Medications:  Allergies as of 05/28/2019      Reactions   Codeine    Chlorhexidine Itching, Rash      Medication List       Accurate as of May 28, 2019 11:27 AM. If you have any questions, ask your nurse or doctor.        accu-chek soft touch lancets Test fasting blood sugar every morning. DX: DM without complications-E11.9   albuterol 108 (90 Base) MCG/ACT inhaler Commonly known as: VENTOLIN HFA Inhale 2 puffs into the lungs every 6 (six) hours as needed for wheezing or shortness of breath (Patient states he only takes if he has a URI).   atorvastatin 20 MG tablet Commonly known as: LIPITOR TAKE 1 TABLET BY MOUTH EVERY DAY   celecoxib 200 MG capsule Commonly known as: CELEBREX Take 1 capsule (200 mg total) by mouth 2 (two) times daily.   cetirizine 10 MG tablet Commonly known as: ZYRTEC Take 1 tablet by mouth daily.   esomeprazole 40 MG capsule Commonly known as: NEXIUM TAKE 1 CAPSULE BY MOUTH EVERY DAY   finasteride 5 MG tablet Commonly known as: PROSCAR Take 1 tablet (5 mg total) by mouth daily.   gabapentin 600 MG tablet Commonly known as:  NEURONTIN TAKE 1 TABLET BY MOUTH BID AS NEEDED FOR NECK PAIN.   glipiZIDE 5 MG tablet Commonly known as: GLUCOTROL Take 1 tablet (5 mg total) by mouth daily before breakfast.   glucose blood test strip Test fasting blood sugar every morning. DX: DM without complications-E11.9   lisinopril 20 MG tablet Commonly known as: ZESTRIL TAKE 1 TABLET BY MOUTH EVERY DAY   lisinopril-hydrochlorothiazide 20-25 MG tablet Commonly known as: ZESTORETIC Take 1 tablet by mouth daily.   POTASSIUM CHLORIDE PO Take by mouth. Takes OTC potassium       Allergies:  Allergies  Allergen Reactions  . Codeine   . Chlorhexidine Itching and Rash    Family History: Family History  Problem Relation Age of Onset  . Diabetes Mother   . Lumbar disc disease Father   .  Prostate cancer Father   . Healthy Daughter   . Healthy Son   . Diabetes Maternal Grandmother   . Diabetes Paternal Grandmother     Social History:  reports that he has never smoked. His smokeless tobacco use includes chew. He reports current alcohol use. He reports that he does not use drugs.   Physical Exam: BP (!) 164/99   Pulse 92   Ht 5\' 10"  (1.778 m)   Wt 232 lb (105.2 kg)   BMI 33.29 kg/m   Constitutional:  Alert and oriented, No acute distress. HEENT: [place] AT, moist mucus membranes.  Trachea midline, no masses. Cardiovascular: No clubbing, cyanosis, or edema. Respiratory: Normal respiratory effort, no increased work of breathing. GU: No CVA tenderness. Normal sphinctor tone, mildy 45 cc enlarged prostate, non tender and no nodules Skin: No rashes, bruises or suspicious lesions. Neurologic: Grossly intact, no focal deficits, moving all 4 extremities. Psychiatric: Normal mood and affect.  Assessment & Plan:    1. Elevated PSA PSA is 4.2 which stable from previous PSA of 4.3 Asymptomatic  Continue annual f/u with PSA/ DRE Previous w/u reassuring  2. Benign prostatic hyperplasia without lower urinary tract symptoms Currently on finasteride primarily for chemoprevention but is also improved his stream and nocturia Pt symptoms of nocturia improved - 3x to 0-1x a night   Return in about 1 year (around 05/27/2020) for PSA/ DRE.  [facility] [address], [place],  [address] [phone]  I, Calvin Horton, am acting as a scribe for Dr. Hollice Horton,  Calvin Espy, MD

## 2019-06-09 ENCOUNTER — Other Ambulatory Visit: Payer: Self-pay | Admitting: Family Medicine

## 2019-06-09 NOTE — Telephone Encounter (Signed)
Requested medication (s) are due for refill today: Yes  Requested medication (s) are on the active medication list: Yes  Last refill:  05/30/18  Future visit scheduled: No  Notes to clinic:  Prescription has expired.    Requested Prescriptions  Pending Prescriptions Disp Refills   ACCU-CHEK AVIVA PLUS test strip [Pharmacy Med Name: ACCU-CHEK AVIVA PLUS TEST STRP] 100 strip 4    Sig: TEST FASTING BLOOD SUGAR EVERY MORNING      Endocrinology: Diabetes - Testing Supplies Passed - 06/09/2019  1:29 AM      Passed - Valid encounter within last 12 months    Recent Outpatient Visits           3 months ago Diabetes mellitus without complication [facility])   [facility], [place], [place]   6 months ago Hyperlipidemia associated with type 2 diabetes mellitus (HCC)   [facility] [facility], Jodell Cipro, [place]   10 months ago Annual physical exam   [facility] [facility], [place] E, [place]   1 year ago Diabetes mellitus without complication [facility])   [employer], [place] Cipro, [place]   1 year ago Encounter for biometric screening   [employer], [place] Cipro, [place]       Future Appointments             In 11 months Vanna Scotland, MD [facility]

## 2019-07-06 ENCOUNTER — Other Ambulatory Visit: Payer: Self-pay | Admitting: Family Medicine

## 2019-07-06 DIAGNOSIS — M4802 Spinal stenosis, cervical region: Secondary | ICD-10-CM

## 2019-07-06 NOTE — Telephone Encounter (Signed)
Requested Prescriptions  Pending Prescriptions Disp Refills  . celecoxib (CELEBREX) 200 MG capsule [Pharmacy Med Name: CELECOXIB 200 MG CAPSULE] 180 capsule 2    Sig: TAKE 1 CAPSULE BY MOUTH TWICE A DAY     Analgesics:  COX2 Inhibitors Passed - 07/06/2019 12:54 AM      Passed - HGB in normal range and within 360 days    Hemoglobin  Date Value Ref Range Status  02/25/2019 15.8 13.0 - 17.7 g/dL Final         Passed - Cr in normal range and within 360 days    Creatinine, Ser  Date Value Ref Range Status  02/25/2019 0.96 0.76 - 1.27 mg/dL Final         Passed - Patient is not pregnant      Passed - Valid encounter within last 12 months    Recent Outpatient Visits          4 months ago Diabetes mellitus without complication [facility])   [facility], [place], [place]   7 months ago Hyperlipidemia associated with type 2 diabetes mellitus (HCC)   [employer] [facility], Jod[place] Cipro, [place]   11 months ago Annual physical exam   [facility] [facility], [place], [place]   1 year ago Diabetes mellitus without complication [facility])   [employer], [place]od[place] Cipro, [place]   1 year ago Encounter for biometric screening   [employer], [place] C[place], [place]      Future Appointments            In 10 months Vanna Scotland, MD [facility]

## 2019-08-15 ENCOUNTER — Encounter: Payer: Managed Care, Other (non HMO) | Admitting: Family Medicine

## 2019-08-15 NOTE — Progress Notes (Signed)
Complete physical exam   Patient: Calvin Horton   DOB: 11-02-1960   59 y.o. Male  MRN: [id number] Visit Date: 08/19/2019  Today's healthcare provider: Dortha Kern, PA   Chief Complaint  Patient presents with  . Annual Exam   Subjective    Calvin Horton is a 59 y.o. male who presents today for a complete physical exam.  He reports consuming a general diet. Home exercise routine includes treadmill and light weight lifting. He generally feels well. He reports sleeping well. He does not have additional problems to discuss today. FBS has been 80-100 at home.  Past Medical History:  Diagnosis Date  . Allergy   . Elevated PSA   . GERD (gastroesophageal reflux disease)   . Hypertension    Past Surgical History:  Procedure Laterality Date  . CHOLECYSTECTOMY    . NECK SURGERY  2016   4-5-6 vertabae on right and 4-5 on left  . TONSILLECTOMY     Social History   Socioeconomic History  . Marital status: Married    Spouse name: Not on file  . Number of children: Not on file  . Years of education: Not on file  . Highest education level: Not on file  Occupational History  . Not on file  Tobacco Use  . Smoking status: Never Smoker  . Smokeless tobacco: Current User    Types: Chew  Substance and Sexual Activity  . Alcohol use: Yes    Alcohol/week: 0.0 standard drinks    Comment: occasionally  . Drug use: No  . Sexual activity: Not on file  Other Topics Concern  . Not on file  Social History Narrative  . Not on file   Social Determinants of Health   Financial Resource Strain:   . Difficulty of Paying Living Expenses:   Food Insecurity:   . Worried About Programme researcher, broadcasting/film/video in the Last Year:   . Barista in the Last Year:   Transportation Needs:   . Freight forwarder (Medical):   Marland Kitchen Lack of Transportation (Non-Medical):   Physical Activity:   . Days of Exercise per Week:   . Minutes of Exercise per Session:   Stress:   . Feeling of  Stress :   Social Connections:   . Frequency of Communication with Friends and Family:   . Frequency of Social Gatherings with Friends and Family:   . Attends Religious Services:   . Active Member of Clubs or Organizations:   . Attends Banker Meetings:   Marland Kitchen Marital Status:   Intimate Partner Violence:   . Fear of Current or Ex-Partner:   . Emotionally Abused:   Marland Kitchen Physically Abused:   . Sexually Abused:    Family Status  Relation Name Status  . Mother  Alive  . Father  Deceased  . Daughter  Alive  . Son  Alive  . MGM  Deceased  . MGF  Deceased  . PGM  Deceased  . PGF  Deceased   Family History  Problem Relation Age of Onset  . Diabetes Mother   . Lumbar disc disease Father   . Prostate cancer Father   . Healthy Daughter   . Healthy Son   . Diabetes Maternal Grandmother   . Diabetes Paternal Grandmother    Allergies  Allergen Reactions  . Codeine   . Chlorhexidine Itching and Rash    Patient Care Team: Omar Gayden, Jodell Cipro, PA as PCP - General (  Family Medicine)   Medications: Outpatient Medications Prior to Visit  Medication Sig  . ACCU-CHEK AVIVA PLUS test strip TEST FASTING BLOOD SUGAR EVERY MORNING  . albuterol (PROVENTIL HFA;VENTOLIN HFA) 108 (90 Base) MCG/ACT inhaler Inhale 2 puffs into the lungs every 6 (six) hours as needed for wheezing or shortness of breath (Patient states he only takes if he has a URI).  Marland Kitchen atorvastatin (LIPITOR) 20 MG tablet TAKE 1 TABLET BY MOUTH EVERY DAY  . celecoxib (CELEBREX) 200 MG capsule TAKE 1 CAPSULE BY MOUTH TWICE A DAY  . cetirizine (ZYRTEC) 10 MG tablet Take 1 tablet by mouth daily.  Marland Kitchen esomeprazole (NEXIUM) 40 MG capsule TAKE 1 CAPSULE BY MOUTH EVERY DAY  . finasteride (PROSCAR) 5 MG tablet Take 1 tablet (5 mg total) by mouth daily.  Marland Kitchen gabapentin (NEURONTIN) 600 MG tablet TAKE 1 TABLET BY MOUTH BID AS NEEDED FOR NECK PAIN.  . glipiZIDE (GLUCOTROL) 5 MG tablet Take 1 tablet (5 mg total) by mouth daily before  breakfast.  . Lancets (ACCU-CHEK SOFT TOUCH) lancets Test fasting blood sugar every morning. DX: DM without complications-E11.9  . lisinopril (ZESTRIL) 20 MG tablet TAKE 1 TABLET BY MOUTH EVERY DAY  . lisinopril-hydrochlorothiazide (ZESTORETIC) 20-25 MG tablet Take 1 tablet by mouth daily.  Marland Kitchen POTASSIUM CHLORIDE PO Take by mouth. Takes OTC potassium   No facility-administered medications prior to visit.    Review of Systems  Constitutional: Negative.   HENT: Negative.   Eyes: Negative.   Respiratory: Negative.   Cardiovascular: Negative.   Gastrointestinal: Negative.   Endocrine: Negative.   Genitourinary: Negative.   Musculoskeletal: Positive for back pain.  Skin: Negative.   Neurological: Negative.   Hematological: Negative.   Psychiatric/Behavioral: Negative.      Objective    BP 120/70 (BP Location: Left Arm, Patient Position: Sitting, Cuff Size: Large)   Pulse 71   Temp (!) 97.1 F (36.2 C) (Temporal)   Resp 16   Ht 5\' 10"  (1.778 m)   Wt 231 lb 12.8 oz (105.1 kg)   SpO2 96%   BMI 33.26 kg/m    Wt Readings from Last 3 Encounters:  08/19/19 231 lb 12.8 oz (105.1 kg)  05/28/19 232 lb (105.2 kg)  02/25/19 237 lb (107.5 kg)    Physical Exam Constitutional:      Appearance: Normal appearance. He is obese.  HENT:     Head: Normocephalic and atraumatic.     Right Ear: Tympanic membrane, ear canal and external ear normal.     Left Ear: Tympanic membrane, ear canal and external ear normal.     Nose: Nose normal.     Mouth/Throat:     Mouth: Mucous membranes are moist.     Pharynx: Oropharynx is clear.  Eyes:     General: No scleral icterus.    Extraocular Movements: Extraocular movements intact.     Conjunctiva/sclera: Conjunctivae normal.     Pupils: Pupils are equal, round, and reactive to light.  Cardiovascular:     Rate and Rhythm: Normal rate and regular rhythm.     Pulses: Normal pulses.     Heart sounds: Normal heart sounds.  Pulmonary:     Effort:  Pulmonary effort is normal.     Breath sounds: Normal breath sounds.  Abdominal:     General: Abdomen is flat. Bowel sounds are normal.     Palpations: Abdomen is soft.  Musculoskeletal:        General: Normal range of motion.  Cervical back: Normal range of motion and neck supple.     Comments: Minimal crepitus in knees.  Skin:    General: Skin is dry.  Neurological:     General: No focal deficit present.     Mental Status: He is alert and oriented to person, place, and time.  Psychiatric:        Mood and Affect: Mood normal.        Thought Content: Thought content normal.     Depression Screen  PHQ 2/9 Scores 08/19/2019 08/09/2018 09/04/2017  PHQ - 2 Score 0 0 0  PHQ- 9 Score - - 0   Fall Risk  08/19/2019 08/09/2018 09/04/2017  Falls in the past year? 0 0 No  Number falls in past yr: 0 - -  Injury with Fall? 0 - -  Risk for fall due to : No Fall Risks - -  Follow up Falls evaluation completed - -     Functional Status Survey: Is the patient deaf or have difficulty hearing?: No Does the patient have difficulty seeing, even when wearing glasses/contacts?: No Does the patient have difficulty concentrating, remembering, or making decisions?: No Does the patient have difficulty walking or climbing stairs?: No Does the patient have difficulty dressing or bathing?: No Does the patient have difficulty doing errands alone such as visiting a doctor's office or shopping?: No     Office Visit from 08/19/2019 in [facility]  AUDIT-C Score  2       No results found for any visits on 08/19/19.  Assessment & Plan    Routine Health Maintenance and Physical Exam  Exercise Activities and Dietary recommendations Goals   Continue to exercise with treadmill and follow low fat diet. Drink 4-6 glasses of water a day.     Immunization History  Administered Date(s) Administered  . Hepatitis A 02/15/2018  . Hepatitis B 08/09/1990, 09/09/1990, 03/12/1991  . Moderna  SARS-COVID-2 Vaccination 04/02/2019, 05/02/2019  . Td 03/12/2007, 11/12/2018  . Tdap 03/12/2007    Health Maintenance  Topic Date Due  . PNEUMOCOCCAL POLYSACCHARIDE VACCINE AGE 54-64 HIGH RISK  Never done  . HEMOGLOBIN A1C  08/25/2019  . INFLUENZA VACCINE  11/02/2019  . FOOT EXAM  11/12/2019  . OPHTHALMOLOGY EXAM  04/10/2020  . COLONOSCOPY  04/03/2022  . TETANUS/TDAP  11/11/2028  . COVID-19 Vaccine  Completed  . Hepatitis C Screening  Completed  . HIV Screening  Completed    Discussed health benefits of physical activity, and encouraged him to engage in regular exercise appropriate for his age and condition.  1. Annual physical exam Good general health. Has lost 6 more lbs in the past 6 months. Has had both COVID vaccinations and will wait on pneumonia vaccination until age 27. Given anticipatory counseling. - CBC with Differential/Platelet - Hemoglobin A1c - Comprehensive metabolic panel  2. Diabetes mellitus without complication (HCC) Stable blood sugar control without hypoglycemia. Still taking Glipizide 5 mg qd and working on weight loss (goal is 210-205 lbs by December 2021). Check labs and continue statin with ACE-I. Continue annual ophthalmology evaluation. Foot exam due in the Fall of 2021. - CBC with Differential/Platelet - Hemoglobin A1c - Comprehensive metabolic panel - Lipid panel  3. Benign prostatic hyperplasia without lower urinary tract symptoms No nocturia and urine stream better since being on the Finasteride 5 mg qd. Followed at [facility] in February 2021 (PSA was 4.2). Recheck CMP and follow up prn. - Comprehensive metabolic panel  4. Gastroesophageal reflux disease without  esophagitis No nocturnal reflux. Symptoms well controlled with using Nexium 40 mg qd. No melena or hematemesis. - CBC with Differential/Platelet  5. Mixed hyperlipidemia Tolerating Atorvastatin 20 mg qd without muscle pains. Continue weight control diet and exercise program. Recheck CMP and  Lipid panel. - Comprehensive metabolic panel - Lipid panel  6. Essential hypertension, benign Well controlled with weight loss, low fat diet, exercise, Lisinopril 20 mg qd with Zestoretic 20-25 mg qd. No chest pains, dyspnea, peripheral edema or dizziness. Recheck labs. - CBC with Differential/Platelet - Comprehensive metabolic panel - TSH  7. Spinal stenosis in cervical region Stable and well controlled by using Celebrex for arthritis and Gabapentin 600 mg 1-2 times a day. No numbness or weakness.   No follow-ups on file.     Andres Shad, PA, have reviewed all documentation for this visit. The documentation on 08/19/19 for the exam, diagnosis, procedures, and orders are all accurate and complete.    Vernie Murders, [place] [phone] (phone) [phone] (fax)  [place]

## 2019-08-19 ENCOUNTER — Ambulatory Visit (INDEPENDENT_AMBULATORY_CARE_PROVIDER_SITE_OTHER): Payer: Managed Care, Other (non HMO) | Admitting: Family Medicine

## 2019-08-19 ENCOUNTER — Other Ambulatory Visit: Payer: Self-pay

## 2019-08-19 ENCOUNTER — Encounter: Payer: Self-pay | Admitting: Family Medicine

## 2019-08-19 VITALS — BP 120/70 | HR 71 | Temp 97.1°F | Resp 16 | Ht 70.0 in | Wt 231.8 lb

## 2019-08-19 DIAGNOSIS — E119 Type 2 diabetes mellitus without complications: Secondary | ICD-10-CM | POA: Diagnosis not present

## 2019-08-19 DIAGNOSIS — I10 Essential (primary) hypertension: Secondary | ICD-10-CM

## 2019-08-19 DIAGNOSIS — N400 Enlarged prostate without lower urinary tract symptoms: Secondary | ICD-10-CM

## 2019-08-19 DIAGNOSIS — Z0000 Encounter for general adult medical examination without abnormal findings: Secondary | ICD-10-CM | POA: Diagnosis not present

## 2019-08-19 DIAGNOSIS — K219 Gastro-esophageal reflux disease without esophagitis: Secondary | ICD-10-CM

## 2019-08-19 DIAGNOSIS — E782 Mixed hyperlipidemia: Secondary | ICD-10-CM

## 2019-08-19 DIAGNOSIS — M4802 Spinal stenosis, cervical region: Secondary | ICD-10-CM

## 2019-08-19 NOTE — Patient Instructions (Signed)

## 2019-08-20 LAB — HEMOGLOBIN A1C
Est. average glucose Bld gHb Est-mCnc: 108 mg/dL
Hgb A1c MFr Bld: 5.4 % (ref 4.8–5.6)

## 2019-08-20 LAB — CBC WITH DIFFERENTIAL/PLATELET
Basophils Absolute: 0 10*3/uL (ref 0.0–0.2)
Basos: 1 %
EOS (ABSOLUTE): 0.1 10*3/uL (ref 0.0–0.4)
Eos: 1 %
Hematocrit: 45.8 % (ref 37.5–51.0)
Hemoglobin: 15.6 g/dL (ref 13.0–17.7)
Immature Grans (Abs): 0 10*3/uL (ref 0.0–0.1)
Immature Granulocytes: 0 %
Lymphocytes Absolute: 2.1 10*3/uL (ref 0.7–3.1)
Lymphs: 27 %
MCH: 33 pg (ref 26.6–33.0)
MCHC: 34.1 g/dL (ref 31.5–35.7)
MCV: 97 fL (ref 79–97)
Monocytes Absolute: 0.6 10*3/uL (ref 0.1–0.9)
Monocytes: 8 %
Neutrophils Absolute: 5 10*3/uL (ref 1.4–7.0)
Neutrophils: 63 %
Platelets: 211 10*3/uL (ref 150–450)
RBC: 4.73 x10E6/uL (ref 4.14–5.80)
RDW: 12.6 % (ref 11.6–15.4)
WBC: 7.7 10*3/uL (ref 3.4–10.8)

## 2019-08-20 LAB — COMPREHENSIVE METABOLIC PANEL
ALT: 21 IU/L (ref 0–44)
AST: 25 IU/L (ref 0–40)
Albumin/Globulin Ratio: 2 (ref 1.2–2.2)
Albumin: 4.4 g/dL (ref 3.8–4.9)
Alkaline Phosphatase: 65 IU/L (ref 48–121)
BUN/Creatinine Ratio: 14 (ref 9–20)
BUN: 15 mg/dL (ref 6–24)
Bilirubin Total: 1.5 mg/dL — ABNORMAL HIGH (ref 0.0–1.2)
CO2: 28 mmol/L (ref 20–29)
Calcium: 9.7 mg/dL (ref 8.7–10.2)
Chloride: 103 mmol/L (ref 96–106)
Creatinine, Ser: 1.04 mg/dL (ref 0.76–1.27)
GFR calc Af Amer: 92 mL/min/{1.73_m2} (ref >59)
GFR calc non Af Amer: 79 mL/min/{1.73_m2} (ref >59)
Globulin, Total: 2.2 g/dL (ref 1.5–4.5)
Glucose: 65 mg/dL (ref 65–99)
Potassium: 4.3 mmol/L (ref 3.5–5.2)
Sodium: 145 mmol/L — ABNORMAL HIGH (ref 134–144)
Total Protein: 6.6 g/dL (ref 6.0–8.5)

## 2019-08-20 LAB — LIPID PANEL
Chol/HDL Ratio: 1.9 ratio (ref 0.0–5.0)
Cholesterol, Total: 134 mg/dL (ref 100–199)
HDL: 72 mg/dL (ref >39)
LDL Chol Calc (NIH): 50 mg/dL (ref 0–99)
Triglycerides: 58 mg/dL (ref 0–149)
VLDL Cholesterol Cal: 12 mg/dL (ref 5–40)

## 2019-08-20 LAB — TSH: TSH: 0.91 u[IU]/mL (ref 0.450–4.500)

## 2019-08-22 ENCOUNTER — Telehealth: Payer: Self-pay | Admitting: *Deleted

## 2019-08-22 NOTE — Telephone Encounter (Signed)
-----   Message from [employer], [place] sent at 08/22/2019 11:00 AM EDT ----- All blood tests essentially normal. Perfect Hgb A1C, liver and kidney function with perfect cholesterol levels. Continue to monitor blood sugar and recheck progress in 6 months.

## 2019-08-22 NOTE — Telephone Encounter (Signed)
LMOVM for pt to return call 

## 2019-11-20 ENCOUNTER — Other Ambulatory Visit: Payer: Self-pay | Admitting: Family Medicine

## 2019-11-20 DIAGNOSIS — M4802 Spinal stenosis, cervical region: Secondary | ICD-10-CM

## 2019-11-29 ENCOUNTER — Other Ambulatory Visit: Payer: Self-pay | Admitting: Family Medicine

## 2019-11-29 DIAGNOSIS — J209 Acute bronchitis, unspecified: Secondary | ICD-10-CM

## 2020-01-28 ENCOUNTER — Encounter: Payer: Self-pay | Admitting: Family Medicine

## 2020-02-13 ENCOUNTER — Other Ambulatory Visit: Payer: Self-pay

## 2020-02-13 ENCOUNTER — Encounter: Payer: Self-pay | Admitting: Family Medicine

## 2020-02-13 ENCOUNTER — Ambulatory Visit: Payer: Managed Care, Other (non HMO) | Admitting: Family Medicine

## 2020-02-13 VITALS — BP 122/76 | HR 92 | Temp 98.2°F | Resp 18 | Ht 70.0 in | Wt 249.0 lb

## 2020-02-13 DIAGNOSIS — E119 Type 2 diabetes mellitus without complications: Secondary | ICD-10-CM | POA: Diagnosis not present

## 2020-02-13 DIAGNOSIS — I10 Essential (primary) hypertension: Secondary | ICD-10-CM

## 2020-02-13 DIAGNOSIS — E1169 Type 2 diabetes mellitus with other specified complication: Secondary | ICD-10-CM | POA: Diagnosis not present

## 2020-02-13 DIAGNOSIS — N400 Enlarged prostate without lower urinary tract symptoms: Secondary | ICD-10-CM | POA: Diagnosis not present

## 2020-02-13 DIAGNOSIS — E785 Hyperlipidemia, unspecified: Secondary | ICD-10-CM

## 2020-02-13 LAB — POCT GLYCOSYLATED HEMOGLOBIN (HGB A1C)
Est. average glucose Bld gHb Est-mCnc: 114
Hemoglobin A1C: 5.6 % (ref 4.0–5.6)

## 2020-02-13 MED ORDER — FINASTERIDE 5 MG PO TABS: 5.0000 mg | ORAL_TABLET | Freq: Every day | ORAL | 3 refills | Status: DC

## 2020-02-13 MED ORDER — LISINOPRIL-HYDROCHLOROTHIAZIDE 20-25 MG PO TABS: 1.0000 | ORAL_TABLET | Freq: Every day | ORAL | 3 refills | Status: DC

## 2020-02-13 MED ORDER — LISINOPRIL 20 MG PO TABS: 20.0000 mg | ORAL_TABLET | Freq: Every day | ORAL | 3 refills | Status: DC

## 2020-02-13 MED ORDER — GLIPIZIDE 5 MG PO TABS: 5.0000 mg | ORAL_TABLET | Freq: Every day | ORAL | 3 refills | Status: DC

## 2020-02-13 NOTE — Progress Notes (Signed)
I,Calvin Horton,acting as a Neurosurgeon for Calvin Southern, PA.,have documented all relevant documentation on the behalf of Calvin Southern, PA,as directed by  Calvin Southern, PA while in the presence of Calvin Horton, Horton.   Established patient visit   Patient: Calvin Horton   DOB: Sep 21, 1960   59 y.o. Male  MRN: [id number] Visit Date: 02/13/2020  Today's healthcare provider: Dortha Kern, PA   No chief complaint on file.  Subjective    HPI  Diabetes Mellitus Type II, follow-up  Lab Results  Component Value Date   HGBA1C 5.4 08/19/2019   HGBA1C 5.3 02/25/2019   HGBA1C 5.9 (A) 08/09/2018   Last seen for diabetes 6 months ago.  Management since then includes continuing the same treatment. He reports excellent compliance with treatment. He is not having side effects.   Home blood sugar records: fasting range: 95-105  Episodes of hypoglycemia? No    Current insulin regiment: None Most Recent Eye Exam: 04-11-19  --------------------------------------------------------------------  Hypertension, follow-up  BP Readings from Last 3 Encounters:  08/19/19 120/70  05/28/19 (!) 164/99  02/25/19 138/82   Wt Readings from Last 3 Encounters:  08/19/19 231 lb 12.8 oz (105.1 kg)  05/28/19 232 lb (105.2 kg)  02/25/19 237 lb (107.5 kg)     He was last seen for hypertension 6 months ago.  BP at that visit was 120/70. Management since that visit includes; Well controlled with weight loss, low fat diet, exercise, Lisinopril 20 mg qd with Zestoretic 20-25 mg qd. No chest pains, dyspnea, peripheral edema or dizziness. He reports excellent compliance with treatment. He is not having side effects.  He is exercising. He is adherent to low salt diet.   Outside blood pressures are 125/70.  He does not smoke.  Use of agents associated with hypertension: none.   --------------------------------------------------------------------  Spinal stenosis in cervical region From  08/19/2019-Stable and well controlled by using Celebrex for arthritis and Gabapentin 600 mg 1-2 times a day. No numbness or weakness.    Past Medical History:  Diagnosis Date  . Allergy   . Elevated PSA   . GERD (gastroesophageal reflux disease)   . Hypertension    Past Surgical History:  Procedure Laterality Date  . CHOLECYSTECTOMY    . NECK SURGERY  2016   4-5-6 vertabae on right and 4-5 on left  . TONSILLECTOMY     Social History   Tobacco Use  . Smoking status: Never Smoker  . Smokeless tobacco: Current User    Types: Chew  Substance Use Topics  . Alcohol use: Yes    Alcohol/week: 0.0 standard drinks    Comment: occasionally  . Drug use: No   Family History  Problem Relation Age of Onset  . Diabetes Mother   . Lumbar disc disease Father   . Prostate cancer Father   . Healthy Daughter   . Healthy Son   . Diabetes Maternal Grandmother   . Diabetes Paternal Grandmother    Allergies  Allergen Reactions  . Codeine   . Chlorhexidine Itching and Rash       Medications: Outpatient Medications Prior to Visit  Medication Sig  . ACCU-CHEK AVIVA PLUS test strip TEST FASTING BLOOD SUGAR EVERY MORNING  . albuterol (PROVENTIL HFA;VENTOLIN HFA) 108 (90 Base) MCG/ACT inhaler Inhale 2 puffs into the lungs every 6 (six) hours as needed for wheezing or shortness of breath (Patient states he only takes if he has a URI).  Marland Kitchen atorvastatin (LIPITOR) 20 MG tablet TAKE 1  TABLET BY MOUTH EVERY DAY  . celecoxib (CELEBREX) 200 MG capsule TAKE 1 CAPSULE BY MOUTH TWICE A DAY  . cetirizine (ZYRTEC) 10 MG tablet Take 1 tablet by mouth daily.  Marland Kitchen esomeprazole (NEXIUM) 40 MG capsule TAKE 1 CAPSULE BY MOUTH EVERY DAY  . finasteride (PROSCAR) 5 MG tablet Take 1 tablet (5 mg total) by mouth daily.  Marland Kitchen gabapentin (NEURONTIN) 600 MG tablet TAKE 1 TABLET BY MOUTH TWICE A DAY AS NEEDED FOR NECK PAIN.  . glipiZIDE (GLUCOTROL) 5 MG tablet Take 1 tablet (5 mg total) by mouth daily before breakfast.   . Lancets (ACCU-CHEK SOFT TOUCH) lancets Test fasting blood sugar every morning. DX: DM without complications-E11.9  . lisinopril (ZESTRIL) 20 MG tablet TAKE 1 TABLET BY MOUTH EVERY DAY  . lisinopril-hydrochlorothiazide (ZESTORETIC) 20-25 MG tablet Take 1 tablet by mouth daily.  Marland Kitchen POTASSIUM CHLORIDE PO Take by mouth. Takes OTC potassium   No facility-administered medications prior to visit.    Review of Systems  Constitutional: Negative for appetite change, chills and fever.  Respiratory: Negative for chest tightness, shortness of breath and wheezing.   Cardiovascular: Negative for chest pain and palpitations.  Gastrointestinal: Negative for abdominal pain, nausea and vomiting.      Objective    BP 122/76 (BP Location: Left Arm, Patient Position: Sitting, Cuff Size: Large)   Pulse 92   Temp 98.2 F (36.8 C) (Oral)   Resp 18   Ht 5\' 10"  (1.778 m)   Wt 249 lb (112.9 kg)   SpO2 95%   BMI 35.73 kg/m  BP Readings from Last 3 Encounters:  02/13/20 122/76  08/19/19 120/70  05/28/19 (!) 164/99   Wt Readings from Last 3 Encounters:  02/13/20 249 lb (112.9 kg)  08/19/19 231 lb 12.8 oz (105.1 kg)  05/28/19 232 lb (105.2 kg)      Physical Exam Constitutional:      General: He is not in acute distress.    Appearance: He is well-developed.  HENT:     Head: Normocephalic and atraumatic.     Right Ear: Hearing and tympanic membrane normal.     Left Ear: Hearing and tympanic membrane normal.     Nose: Nose normal.     Mouth/Throat:     Mouth: Mucous membranes are moist.     Pharynx: Oropharynx is clear.  Eyes:     General: Lids are normal. No scleral icterus.       Right eye: No discharge.        Left eye: No discharge.     Conjunctiva/sclera: Conjunctivae normal.  Cardiovascular:     Rate and Rhythm: Normal rate and regular rhythm.     Pulses: Normal pulses.  Pulmonary:     Effort: Pulmonary effort is normal. No respiratory distress.     Breath sounds: Normal breath  sounds.  Abdominal:     General: Bowel sounds are normal.     Palpations: Abdomen is soft.  Musculoskeletal:        General: Normal range of motion.     Cervical back: Neck supple.  Skin:    Findings: No lesion or rash.  Neurological:     Mental Status: He is alert and oriented to person, place, and time.  Psychiatric:        Speech: Speech normal.        Behavior: Behavior normal.        Thought Content: Thought content normal.     Diabetic Foot Form - Detailed  Diabetic Foot Exam - detailed Diabetic Foot exam was performed with the following findings: Yes 02/13/2020  8:30 AM  Visual Foot Exam completed.: Yes  Can the patient see the bottom of their feet?: Yes Are the shoes appropriate in style and fit?: Yes Is there swelling or and abnormal foot shape?: No Is there a claw toe deformity?: No Is there elevated skin temparature?: No Is there foot or ankle muscle weakness?: No Normal Range of Motion: Yes Pulse Foot Exam completed.: Yes  Right posterior Tibialias: Present Left posterior Tibialias: Present  Right Dorsalis Pedis: Present Left Dorsalis Pedis: Present  Sensory Foot Exam Completed.: Yes Semmes-Weinstein Monofilament Test R Site 1-Great Toe: Pos L Site 1-Great Toe: Pos       No results found for any visits on 02/13/20.  Assessment & Plan     1. Diabetes mellitus without complication (HCC) Hgb A1C was 5.6% today. Still on diet, exercise and Glipizide 5 mg before breakfast daily. No hypoglycemic episodes. Refill medications and schedule follow up with fasting labs in 6 months.  - POCT glycosylated hemoglobin (Hb A1C) - glipiZIDE (GLUCOTROL) 5 MG tablet; Take 1 tablet (5 mg total) by mouth daily before breakfast.  Dispense: 90 tablet; Refill: 3  2. Hyperlipidemia associated with type 2 diabetes mellitus (HCC) Tolerating Atorvastatin 20 mg qd without side effects. Continues low fat diabetic diet and regular exercise. Lab Results  Component Value Date   CHOL 134  08/19/2019   HDL 72 08/19/2019   LDLCALC 50 08/19/2019   TRIG 58 08/19/2019   CHOLHDL 1.9 08/19/2019     3. Essential hypertension, benign Well controlled on Lisinopril 40 mg with HCTZ 25 mg qd. No side effects. No chest pains, dyspnea. Palpitations or peripheral edema. Recheck in 6 months for follow up labs. - lisinopril (ZESTRIL) 20 MG tablet; Take 1 tablet (20 mg total) by mouth daily.  Dispense: 90 tablet; Refill: 3 - lisinopril-hydrochlorothiazide (ZESTORETIC) 20-25 MG tablet; Take 1 tablet by mouth daily.  Dispense: 90 tablet; Refill: 3  4. Benign prostatic hyperplasia without lower urinary tract symptoms No significant nocturia. No urinary frequency or dysuria. Continue Proscar. - finasteride (PROSCAR) 5 MG tablet; Take 1 tablet (5 mg total) by mouth daily.  Dispense: 90 tablet; Refill: 3   No follow-ups on file.      Haywood Pao, PA, have reviewed all documentation for this visit. The documentation on 02/13/20 for the exam, diagnosis, procedures, and orders are all accurate and complete.    Dortha Kern, PA  [facility] [phone] (phone) [phone] (fax)  [facility]

## 2020-04-09 ENCOUNTER — Other Ambulatory Visit: Payer: Self-pay | Admitting: Family Medicine

## 2020-04-09 DIAGNOSIS — M4802 Spinal stenosis, cervical region: Secondary | ICD-10-CM

## 2020-04-20 ENCOUNTER — Encounter: Payer: Self-pay | Admitting: Family Medicine

## 2020-04-20 DIAGNOSIS — U071 COVID-19: Secondary | ICD-10-CM

## 2020-04-28 ENCOUNTER — Encounter: Payer: Self-pay | Admitting: Family Medicine

## 2020-04-29 ENCOUNTER — Other Ambulatory Visit: Payer: Self-pay | Admitting: Family Medicine

## 2020-04-29 MED ORDER — ALBUTEROL SULFATE HFA 108 (90 BASE) MCG/ACT IN AERS: 2.0000 | INHALATION_SPRAY | Freq: Four times a day (QID) | RESPIRATORY_TRACT | 2 refills | Status: AC | PRN

## 2020-05-13 ENCOUNTER — Other Ambulatory Visit: Payer: Self-pay | Admitting: Family Medicine

## 2020-05-13 DIAGNOSIS — J209 Acute bronchitis, unspecified: Secondary | ICD-10-CM

## 2020-05-25 ENCOUNTER — Other Ambulatory Visit: Payer: Managed Care, Other (non HMO)

## 2020-05-25 ENCOUNTER — Other Ambulatory Visit: Payer: Self-pay

## 2020-05-25 DIAGNOSIS — R972 Elevated prostate specific antigen [PSA]: Secondary | ICD-10-CM

## 2020-05-26 ENCOUNTER — Ambulatory Visit: Payer: Self-pay | Admitting: Urology

## 2020-05-26 LAB — PSA: Prostate Specific Ag, Serum: 5.9 ng/mL — ABNORMAL HIGH (ref 0.0–4.0)

## 2020-05-27 ENCOUNTER — Ambulatory Visit: Payer: Self-pay | Admitting: Urology

## 2020-05-30 ENCOUNTER — Encounter: Payer: Self-pay | Admitting: Family Medicine

## 2020-05-31 ENCOUNTER — Other Ambulatory Visit: Payer: Self-pay

## 2020-05-31 DIAGNOSIS — M4802 Spinal stenosis, cervical region: Secondary | ICD-10-CM

## 2020-05-31 MED ORDER — GABAPENTIN 600 MG PO TABS: ORAL_TABLET | ORAL | 1 refills | Status: DC

## 2020-06-03 ENCOUNTER — Encounter: Payer: Self-pay | Admitting: Urology

## 2020-06-03 ENCOUNTER — Ambulatory Visit: Payer: Managed Care, Other (non HMO) | Admitting: Urology

## 2020-06-03 ENCOUNTER — Other Ambulatory Visit: Payer: Self-pay

## 2020-06-03 VITALS — BP 122/78 | HR 112 | Ht 70.0 in | Wt 249.0 lb

## 2020-06-03 DIAGNOSIS — N400 Enlarged prostate without lower urinary tract symptoms: Secondary | ICD-10-CM

## 2020-06-03 DIAGNOSIS — R972 Elevated prostate specific antigen [PSA]: Secondary | ICD-10-CM | POA: Diagnosis not present

## 2020-06-03 NOTE — Progress Notes (Signed)
06/03/2020 11:37 AM   Calvin Horton 1961-02-12 [id number]  Referring provider: Tamsen Roers, PA-C [address] [place],  [place] [address]  Chief Complaint  Patient presents with  . Elevated PSA    HPI: 60 year old male with personal treated BPH and elevated PSA who returns today for annual follow-up.  Today, he reports that his symptoms are well controlled and are actually improving.  He is enjoying retirement.  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  Elevated PSA He has a personal history of elevated PSA as high as 16.4 as of 02/2010. This remained around this range and ultimately underwent prostate biopsy on 11/17 with a PSA of 14.6 at that time. This revealed acute and focal inflammation, otherwise no malignancy. There was a small focus of atypical glands concerning for carcinoma but not diagnostic for this. TRUS vol at the timewas104 g.   Biopsy complicated by pain for several days and scrotal swelling.He notes that if he has had this in the future, he would like to have this done under anesthesia as it was incredibly uncomfortable.  He subsequently underwent prostate MRI in 10/2019which showed no suspicious lesions, PI-RADS 1.  Volume at the time was 79 cm.  He is on chronic finasteride for chemoprevention as well as BPH.  +FH prostate cancer (father)  Component     Latest Ref Rng & Units 02/02/2016 08/02/2016 09/06/2017 05/10/2018  Prostate Specific Ag, Serum     0.0 - 4.0 ng/mL 14.6 (H) 8.7 (H) 9.2 (H) 4.3 (H)   Component     Latest Ref Rng & Units 05/21/2019 05/25/2020  Prostate Specific Ag, Serum     0.0 - 4.0 ng/mL 4.2 (H) 5.9 (H)        PMH: Past Medical History:  Diagnosis Date  . Allergy   . Elevated PSA   . GERD (gastroesophageal reflux disease)   . Hypertension     Surgical History: Past Surgical History:  Procedure Laterality Date  . CHOLECYSTECTOMY    . NECK SURGERY  2016   4-5-6 vertabae on right and 4-5 on left  .  TONSILLECTOMY      Home Medications:  Allergies as of 06/03/2020      Reactions   Codeine    Chlorhexidine Itching, Rash      Medication List       Accurate as of June 03, 2020 11:59 PM. If you have any questions, ask your nurse or doctor.        Accu-Chek Aviva Plus test strip Generic drug: glucose blood TEST FASTING BLOOD SUGAR EVERY MORNING   accu-chek soft touch lancets Test fasting blood sugar every morning. DX: DM without complications-E11.9   albuterol 108 (90 Base) MCG/ACT inhaler Commonly known as: VENTOLIN HFA Inhale 2 puffs into the lungs every 6 (six) hours as needed for wheezing or shortness of breath (Patient states he only takes if he has a URI).   atorvastatin 20 MG tablet Commonly known as: LIPITOR TAKE 1 TABLET BY MOUTH EVERY DAY   celecoxib 200 MG capsule Commonly known as: CELEBREX TAKE 1 CAPSULE BY MOUTH TWICE A DAY   cetirizine 10 MG tablet Commonly known as: ZYRTEC Take 1 tablet by mouth daily.   esomeprazole 40 MG capsule Commonly known as: NEXIUM TAKE 1 CAPSULE BY MOUTH EVERY DAY   finasteride 5 MG tablet Commonly known as: PROSCAR Take 1 tablet (5 mg total) by mouth daily.   gabapentin 600 MG tablet Commonly known as: NEURONTIN TAKE 1  TABLET BY MOUTH TWICE A DAY AS NEEDED FOR NECK PAIN.   glipiZIDE 5 MG tablet Commonly known as: GLUCOTROL Take 1 tablet (5 mg total) by mouth daily before breakfast.   lisinopril 20 MG tablet Commonly known as: ZESTRIL Take 1 tablet (20 mg total) by mouth daily.   lisinopril-hydrochlorothiazide 20-25 MG tablet Commonly known as: ZESTORETIC Take 1 tablet by mouth daily.   POTASSIUM CHLORIDE PO Take by mouth. Takes OTC potassium       Allergies:  Allergies  Allergen Reactions  . Codeine   . Chlorhexidine Itching and Rash    Family History: Family History  Problem Relation Age of Onset  . Diabetes Mother   . Lumbar disc disease Father   . Prostate cancer Father   . Healthy Daughter    . Healthy Son   . Diabetes Maternal Grandmother   . Diabetes Paternal Grandmother     Social History:  reports that he has never smoked. His smokeless tobacco use includes chew. He reports current alcohol use. He reports that he does not use drugs.   Physical Exam: BP 122/78   Pulse (!) 112   Ht 5\' 10"  (1.778 m)   Wt 249 lb (112.9 kg)   BMI 35.73 kg/m   Constitutional:  Alert and oriented, No acute distress. HEENT: [place] AT, moist mucus membranes.  Trachea midline, no masses. Cardiovascular: No clubbing, cyanosis, or edema. Respiratory: Normal respiratory effort, no increased work of breathing. Rectal exam: Normal sphincter tone.  Enlarged prostamegaly without nodules. Skin: No rashes, bruises or suspicious lesions. Neurologic: Grossly intact, no focal deficits, moving all 4 extremities. Psychiatric: Normal mood and affect.  Assessment & Plan:    1. Elevated PSA Slight increase in PSA this year although lower than previous  Rectal exam is stable  Given rising PSA, will recheck again in 6 months instead of next year and consider repeat prostate MRI if he continues to rise.  He is agreeable this plan.  - PSA; Future  2. Benign prostatic hyperplasia without lower urinary tract symptoms Doing well on finasteride, conitnue medication  PSA in 6 months (lab only, will call), PSA/ DRE in 1 year   , MD  [facility] [address] [place], [place] [id number]

## 2020-07-23 ENCOUNTER — Other Ambulatory Visit: Payer: Self-pay | Admitting: Family Medicine

## 2020-07-23 DIAGNOSIS — E1169 Type 2 diabetes mellitus with other specified complication: Secondary | ICD-10-CM

## 2020-08-11 ENCOUNTER — Other Ambulatory Visit: Payer: Self-pay | Admitting: Family Medicine

## 2020-08-11 DIAGNOSIS — J209 Acute bronchitis, unspecified: Secondary | ICD-10-CM

## 2020-08-11 NOTE — Telephone Encounter (Signed)
Requested Prescriptions  Pending Prescriptions Disp Refills  . esomeprazole (NEXIUM) 40 MG capsule [Pharmacy Med Name: ESOMEPRAZOLE MAG DR 40 MG CAP] 90 capsule 1    Sig: TAKE 1 CAPSULE BY MOUTH EVERY DAY     Gastroenterology: Proton Pump Inhibitors Passed - 08/11/2020  3:17 AM      Passed - Valid encounter within last 12 months    Recent Outpatient Visits          6 months ago Diabetes mellitus without complication [facility])   [facility] Chrismon, Jodell Cipro, PA-C   11 months ago Annual physical exam   [employer], Jodell Cipro, PA-C   1 year ago Diabetes mellitus without complication [facility])   [facility] Chrismon, Jodell Cipro, PA-C   1 year ago Hyperlipidemia associated with type 2 diabetes mellitus [facility])   [facility] Chrismon, Jodell Cipro, PA-C   2 years ago Annual physical exam   [employer], Jodell Cipro, PA-C      Future Appointments            In 2 days Chrismon, Jodell Cipro, PA-C [facility]   In 10 months Vanna Scotland, MD [facility]

## 2020-08-13 ENCOUNTER — Encounter: Payer: Self-pay | Admitting: Family Medicine

## 2020-08-13 ENCOUNTER — Other Ambulatory Visit: Payer: Self-pay

## 2020-08-13 ENCOUNTER — Ambulatory Visit: Payer: Managed Care, Other (non HMO) | Admitting: Family Medicine

## 2020-08-13 VITALS — BP 116/78 | HR 76 | Temp 97.9°F | Wt 252.0 lb

## 2020-08-13 DIAGNOSIS — E119 Type 2 diabetes mellitus without complications: Secondary | ICD-10-CM | POA: Diagnosis not present

## 2020-08-13 DIAGNOSIS — I10 Essential (primary) hypertension: Secondary | ICD-10-CM | POA: Diagnosis not present

## 2020-08-13 DIAGNOSIS — E785 Hyperlipidemia, unspecified: Secondary | ICD-10-CM | POA: Diagnosis not present

## 2020-08-13 DIAGNOSIS — E1169 Type 2 diabetes mellitus with other specified complication: Secondary | ICD-10-CM

## 2020-08-13 NOTE — Progress Notes (Signed)
Established patient visit   Patient: Calvin Horton   DOB: 11/03/1960   60 y.o. Male  MRN: [id number] Visit Date: 08/13/2020  Today's healthcare provider: Dortha Kern, PA-C   No chief complaint on file.  Subjective    HPI  Diabetes Mellitus Type II, follow-up  Lab Results  Component Value Date   HGBA1C 5.6 02/13/2020   HGBA1C 5.4 08/19/2019   HGBA1C 5.3 02/25/2019   Last seen for diabetes 6 months ago.  Management since then includes continuing the same treatment. He reports excellent compliance with treatment. He is not having side effects.   Home blood sugar records: fasting range: 90's-100's  Episodes of hypoglycemia? Occasionally    Current insulin regiment: None Most Recent Eye Exam: UTD  --------------------------------------------------------------------------------------------------- Hypertension, follow-up  BP Readings from Last 3 Encounters:  08/13/20 116/78  06/03/20 122/78  02/13/20 122/76   Wt Readings from Last 3 Encounters:  08/13/20 252 lb (114.3 kg)  06/03/20 249 lb (112.9 kg)  02/13/20 249 lb (112.9 kg)     He was last seen for hypertension 6 months ago.  He reports excellent compliance with treatment. He is not having side effects.  He is exercising. He is not adherent to low salt diet.   Outside blood pressures are 120's/70's.  He does not smoke.  Use of agents associated with hypertension: none.   --------------------------------------------------------------------------------------------------- Lipid/Cholesterol, follow-up  Last Lipid Panel: Lab Results  Component Value Date   CHOL 134 08/19/2019   LDLCALC 50 08/19/2019   HDL 72 08/19/2019   TRIG 58 08/19/2019    He was last seen for this 6 months ago.  Management since that visit includes no changes.  He reports excellent compliance with treatment. He is not having side effects.   Symptoms: No appetite changes No foot ulcerations  No chest pain No  chest pressure/discomfort  No dyspnea No orthopnea  No fatigue No lower extremity edema  No palpitations No paroxysmal nocturnal dyspnea  No nausea No numbness or tingling of extremity  No polydipsia No polyuria  No speech difficulty No syncope    Last metabolic panel Lab Results  Component Value Date   GLUCOSE 65 08/19/2019   NA 145 (H) 08/19/2019   K 4.3 08/19/2019   BUN 15 08/19/2019   CREATININE 1.04 08/19/2019   GFRNONAA 79 08/19/2019   GFRAA 92 08/19/2019   CALCIUM 9.7 08/19/2019   AST 25 08/19/2019   ALT 21 08/19/2019   The 10-year ASCVD risk score Denman George DC Jr., et al., 2013) is: 6.8%  ---------------------------------------------------------------------------------------------------   Patient Active Problem List   Diagnosis Date Noted  . Hyperlipidemia associated with type 2 diabetes mellitus (HCC) 02/13/2020  . BPH (benign prostatic hyperplasia) 08/09/2018  . Diabetes mellitus without complication (HCC) 05/30/2018  . Arthritis 05/10/2018  . High blood pressure disorder 05/10/2018  . GERD (gastroesophageal reflux disease) 08/02/2016  . Spinal stenosis in cervical region 05/19/2014   Past Medical History:  Diagnosis Date  . Allergy   . Elevated PSA   . GERD (gastroesophageal reflux disease)   . Hypertension    Social History   Tobacco Use  . Smoking status: Never Smoker  . Smokeless tobacco: Current User    Types: Chew  Substance Use Topics  . Alcohol use: Yes    Alcohol/week: 0.0 standard drinks    Comment: occasionally  . Drug use: No   Allergies  Allergen Reactions  . Codeine   . Chlorhexidine Itching and Rash  Medications: Outpatient Medications Prior to Visit  Medication Sig  . ACCU-CHEK AVIVA PLUS test strip TEST FASTING BLOOD SUGAR EVERY MORNING  . albuterol (VENTOLIN HFA) 108 (90 Base) MCG/ACT inhaler Inhale 2 puffs into the lungs every 6 (six) hours as needed for wheezing or shortness of breath (Patient states he only takes if he  has a URI).  Marland Kitchen atorvastatin (LIPITOR) 20 MG tablet TAKE 1 TABLET BY MOUTH EVERY DAY  . celecoxib (CELEBREX) 200 MG capsule TAKE 1 CAPSULE BY MOUTH TWICE A DAY  . cetirizine (ZYRTEC) 10 MG tablet Take 1 tablet by mouth daily.  Marland Kitchen esomeprazole (NEXIUM) 40 MG capsule TAKE 1 CAPSULE BY MOUTH EVERY DAY  . finasteride (PROSCAR) 5 MG tablet Take 1 tablet (5 mg total) by mouth daily.  Marland Kitchen gabapentin (NEURONTIN) 600 MG tablet TAKE 1 TABLET BY MOUTH TWICE A DAY AS NEEDED FOR NECK PAIN.  . glipiZIDE (GLUCOTROL) 5 MG tablet Take 1 tablet (5 mg total) by mouth daily before breakfast.  . Lancets (ACCU-CHEK SOFT TOUCH) lancets Test fasting blood sugar every morning. DX: DM without complications-E11.9  . lisinopril (ZESTRIL) 20 MG tablet Take 1 tablet (20 mg total) by mouth daily.  Marland Kitchen lisinopril-hydrochlorothiazide (ZESTORETIC) 20-25 MG tablet Take 1 tablet by mouth daily.  Marland Kitchen POTASSIUM CHLORIDE PO Take by mouth. Takes OTC potassium   No facility-administered medications prior to visit.    Review of Systems  Constitutional: Negative.   Respiratory: Negative.   Cardiovascular: Negative.   Gastrointestinal: Negative.   Endocrine: Negative.   Skin: Negative for wound.  Neurological: Negative for dizziness, light-headedness and headaches.     Objective    BP 116/78 (BP Location: Right Arm, Patient Position: Sitting, Cuff Size: Large)   Pulse 76   Temp 97.9 F (36.6 C) (Oral)   Wt 252 lb (114.3 kg)   SpO2 98%   BMI 36.16 kg/m   Wt Readings from Last 3 Encounters:  08/13/20 252 lb (114.3 kg)  06/03/20 249 lb (112.9 kg)  02/13/20 249 lb (112.9 kg)   BP Readings from Last 3 Encounters:  08/13/20 116/78  06/03/20 122/78  02/13/20 122/76    Physical Exam Constitutional:      General: He is not in acute distress.    Appearance: He is well-developed.  HENT:     Head: Normocephalic and atraumatic.     Right Ear: Hearing normal.     Left Ear: Hearing normal.     Nose: Nose normal.  Eyes:      General: Lids are normal. No scleral icterus.       Right eye: No discharge.        Left eye: No discharge.     Conjunctiva/sclera: Conjunctivae normal.  Cardiovascular:     Rate and Rhythm: Normal rate and regular rhythm.     Pulses: Normal pulses.     Heart sounds: Normal heart sounds.  Pulmonary:     Effort: Pulmonary effort is normal. No respiratory distress.     Breath sounds: Normal breath sounds.  Abdominal:     General: Bowel sounds are normal.     Palpations: Abdomen is soft.  Musculoskeletal:        General: Normal range of motion.     Cervical back: Normal range of motion and neck supple.  Skin:    Findings: No lesion or rash.  Neurological:     Mental Status: He is alert and oriented to person, place, and time.  Psychiatric:  Speech: Speech normal.        Behavior: Behavior normal.        Thought Content: Thought content normal.     No results found for any visits on 08/13/20.  Assessment & Plan     1. Diabetes mellitus without complication (HCC) FBS in the 90-110 range daily. No hypoglycemic episodes recently. Had ophthalmology exam the Fall of 2021. No polyuria or polydipsia. Recheck labs. - CBC with Differential/Platelet - Comprehensive metabolic panel - Lipid panel - Hemoglobin A1c  2. Hypertension, unspecified type Well controlled BP. No chest pains or palpitations. Continue Lisinopril 20 mg qd with Lisinopril-HCTZ 20/25 mg qd. Recheck labs. - CBC with Differential/Platelet - Comprehensive metabolic panel - TSH  3. Hyperlipidemia associated with type 2 diabetes mellitus (HCC) Tolerating the Lipitor each evening and following low fat diet. Recheck labs. - CBC with Differential/Platelet - Comprehensive metabolic panel - Lipid panel - TSH   No follow-ups on file.      I, Jru Pense, PA-C, have reviewed all documentation for this visit. The documentation on 08/13/20 for the exam, diagnosis, procedures, and orders are all accurate and  complete.    Dortha Kern, PA-C  [facility] [phone] (phone) [phone] (fax)  [facility]

## 2020-08-14 LAB — CBC WITH DIFFERENTIAL/PLATELET
Basophils Absolute: 0.1 10*3/uL (ref 0.0–0.2)
Basos: 1 %
EOS (ABSOLUTE): 0.2 10*3/uL (ref 0.0–0.4)
Eos: 2 %
Hematocrit: 44.6 % (ref 37.5–51.0)
Hemoglobin: 15.3 g/dL (ref 13.0–17.7)
Immature Grans (Abs): 0 10*3/uL (ref 0.0–0.1)
Immature Granulocytes: 0 %
Lymphocytes Absolute: 2.1 10*3/uL (ref 0.7–3.1)
Lymphs: 23 %
MCH: 33 pg (ref 26.6–33.0)
MCHC: 34.3 g/dL (ref 31.5–35.7)
MCV: 96 fL (ref 79–97)
Monocytes Absolute: 0.8 10*3/uL (ref 0.1–0.9)
Monocytes: 9 %
Neutrophils Absolute: 6 10*3/uL (ref 1.4–7.0)
Neutrophils: 65 %
Platelets: 224 10*3/uL (ref 150–450)
RBC: 4.64 x10E6/uL (ref 4.14–5.80)
RDW: 13.2 % (ref 11.6–15.4)
WBC: 9.2 10*3/uL (ref 3.4–10.8)

## 2020-08-14 LAB — COMPREHENSIVE METABOLIC PANEL
ALT: 24 IU/L (ref 0–44)
AST: 24 IU/L (ref 0–40)
Albumin/Globulin Ratio: 2.1 (ref 1.2–2.2)
Albumin: 4.4 g/dL (ref 3.8–4.9)
Alkaline Phosphatase: 71 IU/L (ref 44–121)
BUN/Creatinine Ratio: 12 (ref 9–20)
BUN: 11 mg/dL (ref 6–24)
Bilirubin Total: 0.7 mg/dL (ref 0.0–1.2)
CO2: 30 mmol/L — ABNORMAL HIGH (ref 20–29)
Calcium: 9.4 mg/dL (ref 8.7–10.2)
Chloride: 101 mmol/L (ref 96–106)
Creatinine, Ser: 0.92 mg/dL (ref 0.76–1.27)
Globulin, Total: 2.1 g/dL (ref 1.5–4.5)
Glucose: 115 mg/dL — ABNORMAL HIGH (ref 65–99)
Potassium: 4.7 mmol/L (ref 3.5–5.2)
Sodium: 143 mmol/L (ref 134–144)
Total Protein: 6.5 g/dL (ref 6.0–8.5)
eGFR: 96 mL/min/{1.73_m2} (ref >59)

## 2020-08-14 LAB — HEMOGLOBIN A1C
Est. average glucose Bld gHb Est-mCnc: 123 mg/dL
Hgb A1c MFr Bld: 5.9 % — ABNORMAL HIGH (ref 4.8–5.6)

## 2020-08-14 LAB — LIPID PANEL
Chol/HDL Ratio: 2.2 ratio (ref 0.0–5.0)
Cholesterol, Total: 154 mg/dL (ref 100–199)
HDL: 70 mg/dL (ref >39)
LDL Chol Calc (NIH): 68 mg/dL (ref 0–99)
Triglycerides: 89 mg/dL (ref 0–149)
VLDL Cholesterol Cal: 16 mg/dL (ref 5–40)

## 2020-08-14 LAB — TSH: TSH: 1.47 u[IU]/mL (ref 0.450–4.500)

## 2020-10-19 ENCOUNTER — Other Ambulatory Visit: Payer: Self-pay | Admitting: Family Medicine

## 2020-10-19 DIAGNOSIS — E1169 Type 2 diabetes mellitus with other specified complication: Secondary | ICD-10-CM

## 2020-10-19 NOTE — Telephone Encounter (Signed)
Requested Prescriptions  Pending Prescriptions Disp Refills  . atorvastatin (LIPITOR) 20 MG tablet [Pharmacy Med Name: ATORVASTATIN 20 MG TABLET] 90 tablet 2    Sig: TAKE 1 TABLET BY MOUTH EVERY DAY     Cardiovascular:  Antilipid - Statins Passed - 10/19/2020  4:53 PM      Passed - Total Cholesterol in normal range and within 360 days    Cholesterol, Total  Date Value Ref Range Status  08/13/2020 154 100 - 199 mg/dL Final         Passed - LDL in normal range and within 360 days    LDL Chol Calc (NIH)  Date Value Ref Range Status  08/13/2020 68 0 - 99 mg/dL Final         Passed - HDL in normal range and within 360 days    HDL  Date Value Ref Range Status  08/13/2020 70 >39 mg/dL Final         Passed - Triglycerides in normal range and within 360 days    Triglycerides  Date Value Ref Range Status  08/13/2020 89 0 - 149 mg/dL Final         Passed - Patient is not pregnant      Passed - Valid encounter within last 12 months    Recent Outpatient Visits          2 months ago Diabetes mellitus without complication [facility])   [facility] Chrismon, Jodell Cipro, PA-C   8 months ago Diabetes mellitus without complication (HCC)   [facility] Chrismon, Jodell Cipro, PA-C   1 year ago Annual physical exam   [employer], Jodell Cipro, PA-C   1 year ago Diabetes mellitus without complication [facility])   [facility] Chrismon, Jodell Cipro, PA-C   1 year ago Hyperlipidemia associated with type 2 diabetes mellitus [facility])   [facility] Chrismon, Jodell Cipro, PA-C      Future Appointments            In 7 months Vanna Scotland, MD [facility]

## 2020-11-25 ENCOUNTER — Other Ambulatory Visit: Payer: Self-pay | Admitting: Family Medicine

## 2020-11-25 DIAGNOSIS — M4802 Spinal stenosis, cervical region: Secondary | ICD-10-CM

## 2020-12-07 ENCOUNTER — Other Ambulatory Visit: Payer: Managed Care, Other (non HMO)

## 2020-12-07 ENCOUNTER — Other Ambulatory Visit: Payer: Self-pay

## 2020-12-07 DIAGNOSIS — R972 Elevated prostate specific antigen [PSA]: Secondary | ICD-10-CM

## 2020-12-08 ENCOUNTER — Telehealth: Payer: Self-pay | Admitting: *Deleted

## 2020-12-08 DIAGNOSIS — R972 Elevated prostate specific antigen [PSA]: Secondary | ICD-10-CM

## 2020-12-08 LAB — PSA: Prostate Specific Ag, Serum: 6.5 ng/mL — ABNORMAL HIGH (ref 0.0–4.0)

## 2020-12-08 NOTE — Telephone Encounter (Signed)
Patient informed, ordered MRI. Voiced understanding.

## 2020-12-08 NOTE — Telephone Encounter (Signed)
Please call pt at [phone]

## 2020-12-08 NOTE — Telephone Encounter (Signed)
Pt returned call, I read message from Dr Apolinar Junes and he would like to proceed with the prostate MRI.  He said please return call to [phone].

## 2020-12-08 NOTE — Telephone Encounter (Addendum)
Left patient a VM with details  ----- Message from Vanna Scotland, MD sent at 12/08/2020  8:40 AM EDT ----- PSA is still going up, now up to 6.5 but its been as high as 9.2 back in 2019 around the time that we did a prostate MRI.  I think it is reasonable either to go ahead and repeat the MRI for comparison or alternatively, we can just see what your PSA does 6 months from now and decide at that time.  Either is a reasonable choice to me.  I am happy to proceed with whichever option you feel most comfortable.  Vanna Scotland, MD

## 2020-12-14 ENCOUNTER — Telehealth: Payer: Self-pay | Admitting: *Deleted

## 2020-12-14 NOTE — Telephone Encounter (Signed)
Patient would like to have some kind of medication for his MRI to clam him down and keep still .

## 2020-12-15 MED ORDER — DIAZEPAM 10 MG PO TABS: 10.0000 mg | ORAL_TABLET | Freq: Once | ORAL | 0 refills | Status: AC

## 2020-12-15 NOTE — Telephone Encounter (Signed)
Valium sent to pharmacy.  Take 1 hour before the procedure, must have a driver  Vanna Scotland, MD

## 2020-12-15 NOTE — Telephone Encounter (Signed)
Patient informed, voiced understanding.  °

## 2020-12-15 NOTE — Addendum Note (Signed)
Addended by: Vanna Scotland on: 12/15/2020 08:21 AM   Modules accepted: Orders

## 2020-12-24 ENCOUNTER — Ambulatory Visit
Admission: RE | Admit: 2020-12-24 | Discharge: 2020-12-24 | Disposition: A | Discharge status: Home / Self Care | Payer: Managed Care, Other (non HMO) | Source: Ambulatory Visit | Attending: Urology | Admitting: Urology

## 2020-12-24 ENCOUNTER — Other Ambulatory Visit: Payer: Self-pay

## 2020-12-24 DIAGNOSIS — R972 Elevated prostate specific antigen [PSA]: Secondary | ICD-10-CM | POA: Insufficient documentation

## 2020-12-24 MED ORDER — GADOBUTROL 1 MMOL/ML IV SOLN
10.0000 mL | Freq: Once | INTRAVENOUS | Status: AC | PRN
  Administered 2020-12-24: 10 mL via INTRAVENOUS

## 2021-01-18 ENCOUNTER — Other Ambulatory Visit: Payer: Self-pay

## 2021-01-18 ENCOUNTER — Telehealth: Payer: Self-pay | Admitting: Family Medicine

## 2021-01-18 DIAGNOSIS — J209 Acute bronchitis, unspecified: Secondary | ICD-10-CM

## 2021-01-18 DIAGNOSIS — M4802 Spinal stenosis, cervical region: Secondary | ICD-10-CM

## 2021-01-18 DIAGNOSIS — N400 Enlarged prostate without lower urinary tract symptoms: Secondary | ICD-10-CM

## 2021-01-18 DIAGNOSIS — E119 Type 2 diabetes mellitus without complications: Secondary | ICD-10-CM

## 2021-01-18 DIAGNOSIS — I10 Essential (primary) hypertension: Secondary | ICD-10-CM

## 2021-01-18 NOTE — Telephone Encounter (Signed)
[facility] faxed refill request for the following medications:  finasteride (PROSCAR) 5 MG tablet glipiZIDE (GLUCOTROL) 5 MG tablet lisinopril (ZESTRIL) 20 MG tablet Last Rx: 02/13/20  celecoxib (CELEBREX) 200 MG capsule Last Rx: 04/09/20 9 month supply esomeprazole (NEXIUM) 40 MG capsule Last Rx: 08/11/20 6 month supply  Requesting 90 day supply LOV: 08/13/20 with Maurine Minister No upcoming appt scheduled at this time. Please advise. Thanks TNP

## 2021-01-20 ENCOUNTER — Other Ambulatory Visit: Payer: Self-pay

## 2021-01-20 MED ORDER — ESOMEPRAZOLE MAGNESIUM 40 MG PO CPDR: DELAYED_RELEASE_CAPSULE | ORAL | 0 refills | Status: DC

## 2021-01-20 MED ORDER — CELECOXIB 200 MG PO CAPS: 200.0000 mg | ORAL_CAPSULE | Freq: Two times a day (BID) | ORAL | 0 refills | Status: DC

## 2021-01-20 MED ORDER — FINASTERIDE 5 MG PO TABS
5.0000 mg | ORAL_TABLET | Freq: Every day | ORAL | 0 refills | Status: DC
Start: 2021-01-20 — End: 2021-04-28

## 2021-01-20 MED ORDER — LISINOPRIL-HYDROCHLOROTHIAZIDE 20-25 MG PO TABS
1.0000 | ORAL_TABLET | Freq: Every day | ORAL | 0 refills | Status: DC
Start: 2021-01-20 — End: 2021-06-30

## 2021-01-20 MED ORDER — GLIPIZIDE 5 MG PO TABS: 5.0000 mg | ORAL_TABLET | Freq: Every day | ORAL | 0 refills | Status: DC

## 2021-02-17 ENCOUNTER — Ambulatory Visit: Payer: Managed Care, Other (non HMO) | Admitting: Family Medicine

## 2021-03-02 ENCOUNTER — Telehealth: Payer: Self-pay | Admitting: Family Medicine

## 2021-03-02 DIAGNOSIS — M4802 Spinal stenosis, cervical region: Secondary | ICD-10-CM

## 2021-03-02 MED ORDER — GABAPENTIN 600 MG PO TABS: ORAL_TABLET | ORAL | 1 refills | Status: AC

## 2021-03-02 NOTE — Telephone Encounter (Signed)
[facility] faxed refill request for the following medications:  gabapentin (NEURONTIN) 600 MG tablet   Please advise.

## 2021-04-21 ENCOUNTER — Other Ambulatory Visit: Payer: Self-pay | Admitting: Family Medicine

## 2021-04-21 DIAGNOSIS — M4802 Spinal stenosis, cervical region: Secondary | ICD-10-CM

## 2021-04-21 NOTE — Telephone Encounter (Signed)
Please call for appointment before next refill. Requested Prescriptions  Pending Prescriptions Disp Refills   celecoxib (CELEBREX) 200 MG capsule [Pharmacy Med Name: CELECOXIB 200 MG CAPSULE] 180 capsule 0    Sig: TAKE 1 CAPSULE BY MOUTH TWICE A DAY     Analgesics:  COX2 Inhibitors Passed - 04/21/2021  3:13 AM      Passed - HGB in normal range and within 360 days    Hemoglobin  Date Value Ref Range Status  08/13/2020 15.3 13.0 - 17.7 g/dL Final         Passed - Cr in normal range and within 360 days    Creatinine, Ser  Date Value Ref Range Status  08/13/2020 0.92 0.76 - 1.27 mg/dL Final         Passed - Patient is not pregnant      Passed - Valid encounter within last 12 months    Recent Outpatient Visits          8 months ago Diabetes mellitus without complication [facility])   [facility] Chrismon, Jodell Cipro, PA-C   1 year ago Diabetes mellitus without complication (HCC)   [facility] Chrismon, Jodell Cipro, PA-C   1 year ago Annual physical exam   [employer], Jodell Cipro, PA-C   2 years ago Diabetes mellitus without complication [facility])   [facility] Chrismon, Jodell Cipro, PA-C   2 years ago Hyperlipidemia associated with type 2 diabetes mellitus [facility])   [facility] Chrismon, Jodell Cipro, PA-C      Future Appointments            In 1 month Vanna Scotland, MD [facility]

## 2021-04-28 ENCOUNTER — Other Ambulatory Visit: Payer: Self-pay | Admitting: Family Medicine

## 2021-04-28 DIAGNOSIS — E119 Type 2 diabetes mellitus without complications: Secondary | ICD-10-CM

## 2021-04-28 DIAGNOSIS — N400 Enlarged prostate without lower urinary tract symptoms: Secondary | ICD-10-CM

## 2021-04-28 DIAGNOSIS — J209 Acute bronchitis, unspecified: Secondary | ICD-10-CM

## 2021-04-28 NOTE — Telephone Encounter (Signed)
Requested medication (s) are due for refill today: yes  Requested medication (s) are on the active medication list: yes  Last refill:  08/13/20  Future visit scheduled: no  Notes to clinic:  Has already had  curtesy refill on all three meds, no upcoming appt scheduled, please assess.   Requested Prescriptions  Pending Prescriptions Disp Refills   finasteride (PROSCAR) 5 MG tablet [Pharmacy Med Name: FINASTERIDE 5 MG TABLET] 90 tablet 0    Sig: TAKE 1 TABLET (5 MG TOTAL) BY MOUTH DAILY.     Urology: 5-alpha Reductase Inhibitors Passed - 04/28/2021  1:30 AM      Passed - Valid encounter within last 12 months    Recent Outpatient Visits           8 months ago Diabetes mellitus without complication [facility])   [facility] Chrismon, Jodell Cipro, PA-C   1 year ago Diabetes mellitus without complication [facility])   [facility] Chrismon, Jodell Cipro, PA-C   1 year ago Annual physical exam   [employer], Jodell Cipro, PA-C   2 years ago Diabetes mellitus without complication [facility])   [facility] Chrismon, Jodell Cipro, PA-C   2 years ago Hyperlipidemia associated with type 2 diabetes mellitus (HCC)   [facility] Chrismon, Jodell Cipro, PA-C       Future Appointments             In 1 month Vanna Scotland, MD [facility]             esomeprazole (NEXIUM) 40 MG capsule [Pharmacy Med Name: ESOMEPRAZOLE MAG DR 40 MG CAP] 90 capsule 0    Sig: TAKE 1 CAPSULE BY MOUTH EVERY DAY     Gastroenterology: Proton Pump Inhibitors Passed - 04/28/2021  1:30 AM      Passed - Valid encounter within last 12 months    Recent Outpatient Visits           8 months ago Diabetes mellitus without complication [facility])   [facility] Chrismon, Jodell Cipro, PA-C   1 year ago Diabetes mellitus without complication (HCC)   [facility] Chrismon, Jodell Cipro, PA-C   1 year ago Annual physical exam    [employer], Jodell Cipro, PA-C   2 years ago Diabetes mellitus without complication [facility])   [facility] Chrismon, Jodell Cipro, PA-C   2 years ago Hyperlipidemia associated with type 2 diabetes mellitus [facility])   [facility] Chrismon, Jodell Cipro, PA-C       Future Appointments             In 1 month Vanna Scotland, MD [facility]             glipiZIDE (GLUCOTROL) 5 MG tablet [Pharmacy Med Name: GLIPIZIDE 5 MG TABLET] 90 tablet 0    Sig: TAKE 1 TABLET BY MOUTH DAILY BEFORE BREAKFAST.     Endocrinology:  Diabetes - Sulfonylureas Failed - 04/28/2021  1:30 AM      Failed - HBA1C is between 0 and 7.9 and within 180 days    Hgb A1c MFr Bld  Date Value Ref Range Status  08/13/2020 5.9 (H) 4.8 - 5.6 % Final    Comment:             Prediabetes: 5.7 - 6.4          Diabetes: >6.4          Glycemic control for  adults with diabetes: <7.0           Failed - Valid encounter within last 6 months    Recent Outpatient Visits           8 months ago Diabetes mellitus without complication [facility])   [facility] Chrismon, Jodell Cipro, PA-C   1 year ago Diabetes mellitus without complication [facility])   [facility] Chrismon, Jodell Cipro, PA-C   1 year ago Annual physical exam   [employer], Jodell Cipro, PA-C   2 years ago Diabetes mellitus without complication [facility])   [facility] Chrismon, Jodell Cipro, PA-C   2 years ago Hyperlipidemia associated with type 2 diabetes mellitus [facility])   [facility] Chrismon, Jodell Cipro, PA-C       Future Appointments             In 1 month Vanna Scotland, MD [facility]

## 2021-06-03 ENCOUNTER — Other Ambulatory Visit: Payer: Self-pay

## 2021-06-03 ENCOUNTER — Other Ambulatory Visit: Payer: Managed Care, Other (non HMO)

## 2021-06-03 DIAGNOSIS — R972 Elevated prostate specific antigen [PSA]: Secondary | ICD-10-CM

## 2021-06-04 LAB — PSA: Prostate Specific Ag, Serum: 5.2 ng/mL — ABNORMAL HIGH (ref 0.0–4.0)

## 2021-06-06 NOTE — Progress Notes (Signed)
? ?06/07/21 ?9:46 AM  ? ?Calvin Horton ?[birth year] ?[id number] ? ?Referring provider:  ?Chrismon, Jodell Cipro, PA-C ?No address on file ?Chief Complaint  ?Patient presents with  ? Elevated PSA  ? ? ? ?HPI: ?Calvin Horton is a 61 y.o.male with a personal history of  elevated PSA and BPH without LUTS on finasteride, who presents today for 1 year follow-up with PSA. ? ?He has a personal history of elevated PSA as high as 16.4 as of 02/2010.  This remained around this range and ultimately underwent prostate biopsy on 11/17 with a PSA of 14.6 at that time.  This revealed acute and focal inflammation, otherwise no malignancy.  There was a small focus of atypical glands concerning for carcinoma but not diagnostic for this. TRUS vol at the time was 104 g.  His prostate biopsy was complicated by scrotal swelling and pain for several days.  ? ?Prostate MRI in 01/2018 showed no suspicious lesions and PI-RADS1 ? ?Follow-up MRI in 2022 visualized no focal lesion of intermediate higher suspicion for prostate cancer. It visualized BPH mild prostatomegaly and diffuse low T2 signal in the peripheral zon of the prostate gland likely postinflammatory and is considered PI-RADS 2.  ? ?His most recent PSA was 5.2 on 06/03/2021. ? 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  ? ?He reports that he is on a new blood pressure medication and has experienced increased urinary frequency with full stream. He feels that he empties well. He reports that he stops fluid intake at 9 pm. He reports that around 4am he wakes up to void.  ? ?He reports that yesterday at 12pm he stopped all fluid intake until he returned home. He was still having to urinary frequency and was having strong stream.  ? ?He is a diabetic which he reports his under control.  ? ?PSA trend:  ?Component Prostate Specific Ag, Serum  ?Latest Ref Rng & Units 0.0 - 4.0 ng/mL  ?02/2010 16.4 (H)  ?02/2016 14.6 (H)  ?08/02/2016 8.7 (H)  ?09/06/2017 9.2  (H)  ?05/10/2018 4.3 (H)  ?05/21/2019 4.2 (H)  ?05/25/2020 5.9 (H)  ?12/07/2020 6.5 (H)  ?06/03/2021 5.2 (H)  ? ? ?PMH: ?Past Medical History:  ?Diagnosis Date  ? Allergy   ? Elevated PSA   ? GERD (gastroesophageal reflux disease)   ? Hypertension   ? ? ?Surgical History: ?Past Surgical History:  ?Procedure Laterality Date  ? CHOLECYSTECTOMY    ? NECK SURGERY  2016  ? 4-5-6 vertabae on right and 4-5 on left  ? TONSILLECTOMY    ? ? ?Home Medications:  ?Allergies as of 06/07/2021   ? ?   Reactions  ? Codeine   ? Chlorhexidine Itching, Rash  ? ?  ? ?  ?Medication List  ?  ? ?  ? Accurate as of June 07, 2021  9:46 AM. If you have any questions, ask your nurse or doctor.  ?  ?  ? ?  ? ?Accu-Chek Aviva Plus test strip ?Generic drug: glucose blood ?TEST FASTING BLOOD SUGAR EVERY MORNING ?  ?accu-chek soft touch lancets ?Test fasting blood sugar every morning. DX: DM without complications-E11.9 ?  ?albuterol 108 (90 Base) MCG/ACT inhaler ?Commonly known as: VENTOLIN HFA ?Inhale 2 puffs into the lungs every 6 (six) hours as needed for wheezing or shortness of breath (Patient states he only takes if he has a URI). ?  ?atorvastatin 20 MG tablet ?Commonly known as: LIPITOR ?TAKE 1 TABLET BY  MOUTH EVERY DAY ?  ?celecoxib 200 MG capsule ?Commonly known as: CELEBREX ?TAKE 1 CAPSULE BY MOUTH TWICE A DAY ?  ?cetirizine 10 MG tablet ?Commonly known as: ZYRTEC ?Take 1 tablet by mouth daily. ?  ?esomeprazole 40 MG capsule ?Commonly known as: NEXIUM ?TAKE 1 CAPSULE BY MOUTH EVERY DAY ?  ?finasteride 5 MG tablet ?Commonly known as: PROSCAR ?TAKE 1 TABLET (5 MG TOTAL) BY MOUTH DAILY. ?  ?gabapentin 600 MG tablet ?Commonly known as: NEURONTIN ?TAKE 1 TABLET BY MOUTH TWICE A DAY AS NEEDED FOR NECK PAIN. ?  ?glipiZIDE 5 MG tablet ?Commonly known as: GLUCOTROL ?TAKE 1 TABLET BY MOUTH DAILY BEFORE BREAKFAST. ?  ?lisinopril-hydrochlorothiazide 20-25 MG tablet ?Commonly known as: ZESTORETIC ?Take 1 tablet by mouth daily. ?  ?POTASSIUM CHLORIDE PO ?Take by  mouth. Takes OTC potassium ?  ? ?  ? ? ?Allergies:  ?Allergies  ?Allergen Reactions  ? Codeine   ? Chlorhexidine Itching and Rash  ? ? ?Family History: ?Family History  ?Problem Relation Age of Onset  ? Diabetes Mother   ? Lumbar disc disease Father   ? Prostate cancer Father   ? Healthy Daughter   ? Healthy Son   ? Diabetes Maternal Grandmother   ? Diabetes Paternal Grandmother   ? ? ?Social History:  reports that he has never smoked. His smokeless tobacco use includes chew. He reports current alcohol use. He reports that he does not use drugs. ? ? ?Physical Exam: ?BP (!) 152/81   Pulse 82   Ht 5\' 10"  (1.778 m)   Wt 245 lb (111.1 kg)   BMI 35.15 kg/m?   ?Constitutional:  Alert and oriented, No acute distress. ?HEENT:  AT, moist mucus membranes.  Trachea midline, no masses. ?Cardiovascular: No clubbing, cyanosis, or edema. ?Respiratory: Normal respiratory effort, no increased work of breathing. ?Rectal: Normal sphincter tone,  50  CC prostate, smooth no nodules ?Skin: No rashes, bruises or suspicious lesions. ?Neurologic: Grossly intact, no focal deficits, moving all 4 extremities. ?Psychiatric: Normal mood and affect. ? ? ?Laboratory Data: ? ?Lab Results  ?Component Value Date  ? CREATININE 0.92 08/13/2020  ? ?Lab Results  ?Component Value Date  ? HGBA1C 5.9 (H) 08/13/2020  ? ? ?Assessment & Plan:   ?Elevated PSA  ?- Stably elevated lower than previous  ?- Rectal exam normal ?- Will continue to check PSA  ? ?2. BPH with urinary frequency  ?- Doing well on finasteride, continue medication ?- We discussed addition of Flomax to regimen versus OAB medications. He is interested in addition of OAB medication. We discussed risk of medication.  ?- Oxybutynin 10 mg XL ? ? Return in 6 weeks for IPSS and PVR ? ?I,Kailey Littlejohn,acting as a 08/15/2020 for Neurosurgeon, MD.,have documented all relevant documentation on the behalf of Vanna Scotland, MD,as directed by  Vanna Scotland, MD while in the presence of Vanna Scotland, MD. ?I have reviewed the above documentation for accuracy and completeness, and I agree with the above.  ? ?Vanna Scotland, MD ?[facility] ?[address], [address] ?[place], [place] ?[phone] ?

## 2021-06-07 ENCOUNTER — Ambulatory Visit: Payer: Managed Care, Other (non HMO) | Admitting: Urology

## 2021-06-07 ENCOUNTER — Other Ambulatory Visit: Payer: Self-pay

## 2021-06-07 VITALS — BP 152/81 | HR 82 | Ht 70.0 in | Wt 245.0 lb

## 2021-06-07 DIAGNOSIS — R972 Elevated prostate specific antigen [PSA]: Secondary | ICD-10-CM

## 2021-06-07 DIAGNOSIS — N400 Enlarged prostate without lower urinary tract symptoms: Secondary | ICD-10-CM

## 2021-06-07 MED ORDER — OXYBUTYNIN CHLORIDE ER 10 MG PO TB24: 10.0000 mg | ORAL_TABLET | Freq: Every day | ORAL | 6 refills | Status: DC

## 2021-06-30 ENCOUNTER — Other Ambulatory Visit: Payer: Self-pay | Admitting: Family Medicine

## 2021-06-30 DIAGNOSIS — I10 Essential (primary) hypertension: Secondary | ICD-10-CM

## 2021-07-20 ENCOUNTER — Ambulatory Visit: Payer: Managed Care, Other (non HMO) | Admitting: Urology

## 2021-07-26 ENCOUNTER — Ambulatory Visit: Payer: Managed Care, Other (non HMO) | Admitting: Urology

## 2021-07-27 ENCOUNTER — Other Ambulatory Visit: Payer: Self-pay | Admitting: Family Medicine

## 2021-07-27 DIAGNOSIS — M4802 Spinal stenosis, cervical region: Secondary | ICD-10-CM

## 2021-07-27 DIAGNOSIS — J209 Acute bronchitis, unspecified: Secondary | ICD-10-CM

## 2021-07-27 DIAGNOSIS — N400 Enlarged prostate without lower urinary tract symptoms: Secondary | ICD-10-CM

## 2021-07-27 DIAGNOSIS — E119 Type 2 diabetes mellitus without complications: Secondary | ICD-10-CM

## 2021-07-27 NOTE — Telephone Encounter (Signed)
[facility] faxed refill request for the following medications: ? ?atorvastatin (LIPITOR) 20 MG tablet  ? ?Please advise. ? ?

## 2021-08-01 NOTE — Progress Notes (Signed)
? ?08/02/21 ?3:56 PM  ? ?Calvin AllegraJackie Wayne Pacini ?11-20-1960 ?[id number] ? ?Referring provider:  ?No referring provider defined for this encounter. ?Chief Complaint  ?Patient presents with  ? Benign Prostatic Hypertrophy  ? ? ?HPI: ?Calvin Horton is a 61 y.o.male with a personal history of elevated PSA and BPH with urinary frequency on finasteride and oxybutynin who presents today for a 6 week follow-up with IPSS and PVR.  ? ?He has a personal history of elevated PSA as high as 16.4 as of 02/2010.  This remained around this range and ultimately underwent prostate biopsy on 11/17 with a PSA of 14.6 at that time.  This revealed acute and focal inflammation, otherwise no malignancy.  There was a small focus of atypical glands concerning for carcinoma but not diagnostic for this. TRUS vol at the time was 104 g.  His prostate biopsy was complicated by scrotal swelling and pain for several days.  ? ?His most recent PSA was  5.2 on 06/03/2021.  ? ?Prostate MRI in 01/2018 showed no suspicious lesions and PI-RADS1 ?  ?Follow-up MRI in 2022 visualized no focal lesion of intermediate higher suspicion for prostate cancer. It visualized BPH mild prostatomegaly and diffuse low T2 signal in the peripheral zon of the prostate gland likely postinflammatory and is considered PI-RADS 2.  ? ?He reports that oxybutynin has improved his urinary symptoms. He has dry mouth and he is wondering what he can take for this. He no longer gets up to void during the nighttime.  ? ? IPSS   ? ? Row Name 08/02/21 1500  ?  ?  ?  ? International Prostate Symptom Score  ? How often have you had the sensation of not emptying your bladder? Not at All    ? How often have you had to urinate less than every two hours? Not at All    ? How often have you found you stopped and started again several times when you urinated? About half the time    ? How often have you found it difficult to postpone urination? Less than 1 in 5 times    ? How often have you had a  weak urinary stream? Not at All    ? How often have you had to strain to start urination? Not at All    ? How many times did you typically get up at night to urinate? None    ? Total IPSS Score 4    ?  ? Quality of Life due to urinary symptoms  ? If you were to spend the rest of your life with your urinary condition just the way it is now how would you feel about that? Pleased    ? ?  ?  ? ?  ? ? ?Score:  ?1-7 Mild ?8-19 Moderate ?20-35 Severe ? ? ? ?PMH: ?Past Medical History:  ?Diagnosis Date  ? Allergy   ? Elevated PSA   ? GERD (gastroesophageal reflux disease)   ? Hypertension   ? ? ?Surgical History: ?Past Surgical History:  ?Procedure Laterality Date  ? CHOLECYSTECTOMY    ? NECK SURGERY  2016  ? 4-5-6 vertabae on right and 4-5 on left  ? TONSILLECTOMY    ? ? ?Home Medications:  ?Allergies as of 08/02/2021   ? ?   Reactions  ? Codeine   ? Chlorhexidine Itching, Rash  ? ?  ? ?  ?Medication List  ?  ? ?  ? Accurate as of Aug 02, 2021  3:56 PM. If you have any questions, ask your nurse or doctor.  ?  ?  ? ?  ? ?Accu-Chek Aviva Plus test strip ?Generic drug: glucose blood ?TEST FASTING BLOOD SUGAR EVERY MORNING ?  ?accu-chek soft touch lancets ?Test fasting blood sugar every morning. DX: DM without complications-E11.9 ?  ?albuterol 108 (90 Base) MCG/ACT inhaler ?Commonly known as: VENTOLIN HFA ?Inhale 2 puffs into the lungs every 6 (six) hours as needed for wheezing or shortness of breath (Patient states he only takes if he has a URI). ?  ?atorvastatin 20 MG tablet ?Commonly known as: LIPITOR ?TAKE 1 TABLET BY MOUTH EVERY DAY ?  ?celecoxib 200 MG capsule ?Commonly known as: CELEBREX ?TAKE 1 CAPSULE BY MOUTH TWICE A DAY ?  ?cetirizine 10 MG tablet ?Commonly known as: ZYRTEC ?Take 1 tablet by mouth daily. ?  ?esomeprazole 40 MG capsule ?Commonly known as: NEXIUM ?TAKE 1 CAPSULE BY MOUTH EVERY DAY ?  ?finasteride 5 MG tablet ?Commonly known as: PROSCAR ?TAKE 1 TABLET (5 MG TOTAL) BY MOUTH DAILY. ?  ?gabapentin 600 MG  tablet ?Commonly known as: NEURONTIN ?TAKE 1 TABLET BY MOUTH TWICE A DAY AS NEEDED FOR NECK PAIN. ?  ?glipiZIDE 5 MG tablet ?Commonly known as: GLUCOTROL ?TAKE 1 TABLET BY MOUTH EVERY DAY BEFORE BREAKFAST ?  ?lisinopril-hydrochlorothiazide 20-25 MG tablet ?Commonly known as: ZESTORETIC ?TAKE 1 TABLET BY MOUTH EVERY DAY ?  ?oxybutynin 10 MG 24 hr tablet ?Commonly known as: DITROPAN-XL ?Take 1 tablet (10 mg total) by mouth daily. ?  ?POTASSIUM CHLORIDE PO ?Take by mouth. Takes OTC potassium ?  ? ?  ? ? ?Allergies:  ?Allergies  ?Allergen Reactions  ? Codeine   ? Chlorhexidine Itching and Rash  ? ? ?Family History: ?Family History  ?Problem Relation Age of Onset  ? Diabetes Mother   ? Lumbar disc disease Father   ? Prostate cancer Father   ? Healthy Daughter   ? Healthy Son   ? Diabetes Maternal Grandmother   ? Diabetes Paternal Grandmother   ? ? ?Social History:  reports that he has never smoked. His smokeless tobacco use includes chew. He reports current alcohol use. He reports that he does not use drugs. ? ? ?Physical Exam: ?BP 121/87   Pulse (!) 118   Ht 5\' 10"  (1.778 m)   Wt 245 lb (111.1 kg)   BMI 35.15 kg/m?   ?Constitutional:  Alert and oriented, No acute distress. ?HEENT: [place] AT, moist mucus membranes.  Trachea midline, no masses. ?Cardiovascular: No clubbing, cyanosis, or edema. ?Respiratory: Normal respiratory effort, no increased work of breathing. ?Skin: No rashes, bruises or suspicious lesions. ?Neurologic: Grossly intact, no focal deficits, moving all 4 extremities. ?Psychiatric: Normal mood and affect. ? ?Laboratory Data: ?Lab Results  ?Component Value Date  ? CREATININE 0.92 08/13/2020  ? ?Lab Results  ?Component Value Date  ? HGBA1C 5.9 (H) 08/13/2020  ? ? ?Pertinent Imaging: ?Results for orders placed or performed in visit on 08/02/21  ?BLADDER SCAN AMB NON-IMAGING  ?Result Value Ref Range  ? Scan Result 36 ml   ? ? ?Assessment & Plan:   ?BPH with urinary frequency  ?- Doing well on finasteride and  oxybutynin, patient of oxybutynin has been helpful in addressing his storage related symptoms ?- He is emptying adequately with PVR of 55ml  ?- He has experienced dry mouth on oxybutynin. Discussed holding  oxybutynin for a month and trying a trial of Gemtesa 75 mg x 1 month to see if dry  mouth side effect improves.  Call us and let us know if he thinks this is effective and less side effect profile.  We discussed this may or may not be affordable depending on his formulary. ?- Continue Flomax  ? ?2. History of elevated PSA  ?- Return in 1 year for PSA and DRE  ? ?F/u 1 year for IPSS/ PVR / PSA / DRE ? ?I,Kailey Littlejohn,acting as a Neurosurgeon for Vanna Scotland, MD.,have documented all relevant documentation on the behalf of Vanna Scotland, MD,as directed by  Vanna Scotland, MD while in the presence of Vanna Scotland, MD. ? ? ?[facility] ?[address], [address] ?[place], [place] [address] ?[phone] ? ?

## 2021-08-02 ENCOUNTER — Ambulatory Visit: Payer: Managed Care, Other (non HMO) | Admitting: Urology

## 2021-08-02 VITALS — BP 121/87 | HR 118 | Ht 70.0 in | Wt 245.0 lb

## 2021-08-02 DIAGNOSIS — R350 Frequency of micturition: Secondary | ICD-10-CM

## 2021-08-02 DIAGNOSIS — N401 Enlarged prostate with lower urinary tract symptoms: Secondary | ICD-10-CM

## 2021-08-02 DIAGNOSIS — R972 Elevated prostate specific antigen [PSA]: Secondary | ICD-10-CM

## 2021-08-02 DIAGNOSIS — N400 Enlarged prostate without lower urinary tract symptoms: Secondary | ICD-10-CM

## 2021-08-02 LAB — BLADDER SCAN AMB NON-IMAGING: Scan Result: 36

## 2021-08-02 NOTE — Patient Instructions (Signed)
Please hold Oxybutynin and start Gemtesa. ? ?

## 2021-09-04 ENCOUNTER — Encounter: Payer: Self-pay | Admitting: Urology

## 2021-09-05 MED ORDER — GEMTESA 75 MG PO TABS: 75.0000 mg | ORAL_TABLET | Freq: Every day | ORAL | 11 refills | Status: DC

## 2021-09-07 ENCOUNTER — Encounter: Payer: Self-pay | Admitting: Urology

## 2021-09-22 ENCOUNTER — Telehealth: Payer: Self-pay

## 2021-09-22 MED ORDER — TROSPIUM CHLORIDE 20 MG PO TABS: 20.0000 mg | ORAL_TABLET | Freq: Two times a day (BID) | ORAL | 3 refills | Status: DC

## 2021-09-22 NOTE — Telephone Encounter (Signed)
Medication sent. My chart sent to pt

## 2021-09-23 ENCOUNTER — Other Ambulatory Visit: Payer: Self-pay | Admitting: Family Medicine

## 2021-09-23 DIAGNOSIS — N400 Enlarged prostate without lower urinary tract symptoms: Secondary | ICD-10-CM

## 2021-09-23 DIAGNOSIS — I10 Essential (primary) hypertension: Secondary | ICD-10-CM

## 2021-09-23 DIAGNOSIS — E119 Type 2 diabetes mellitus without complications: Secondary | ICD-10-CM

## 2021-09-23 DIAGNOSIS — J209 Acute bronchitis, unspecified: Secondary | ICD-10-CM

## 2021-09-23 DIAGNOSIS — M4802 Spinal stenosis, cervical region: Secondary | ICD-10-CM

## 2021-12-19 ENCOUNTER — Other Ambulatory Visit: Payer: Self-pay | Admitting: Urology

## 2022-06-16 ENCOUNTER — Other Ambulatory Visit: Payer: Self-pay | Admitting: Urology

## 2022-07-31 ENCOUNTER — Other Ambulatory Visit: Payer: Self-pay

## 2022-07-31 DIAGNOSIS — R972 Elevated prostate specific antigen [PSA]: Secondary | ICD-10-CM

## 2022-07-31 DIAGNOSIS — N400 Enlarged prostate without lower urinary tract symptoms: Secondary | ICD-10-CM

## 2022-08-01 ENCOUNTER — Other Ambulatory Visit: Payer: Managed Care, Other (non HMO)

## 2022-08-01 DIAGNOSIS — R972 Elevated prostate specific antigen [PSA]: Secondary | ICD-10-CM

## 2022-08-01 DIAGNOSIS — N400 Enlarged prostate without lower urinary tract symptoms: Secondary | ICD-10-CM

## 2022-08-02 LAB — PSA: Prostate Specific Ag, Serum: 3 ng/mL (ref 0.0–4.0)

## 2022-08-09 ENCOUNTER — Ambulatory Visit: Payer: Managed Care, Other (non HMO) | Admitting: Urology

## 2022-08-09 VITALS — BP 127/83 | HR 94 | Ht 66.0 in | Wt 220.0 lb

## 2022-08-09 DIAGNOSIS — N401 Enlarged prostate with lower urinary tract symptoms: Secondary | ICD-10-CM | POA: Diagnosis not present

## 2022-08-09 DIAGNOSIS — N400 Enlarged prostate without lower urinary tract symptoms: Secondary | ICD-10-CM

## 2022-08-09 LAB — BLADDER SCAN AMB NON-IMAGING: Scan Result: 14

## 2022-08-09 NOTE — Progress Notes (Signed)
Marcelle Overlie Plume,acting as a scribe for Vanna Scotland, MD.,have documented all relevant documentation on the behalf of Vanna Scotland, MD,as directed by  Vanna Scotland, MD while in the presence of Vanna Scotland, MD.  08/09/2022 4:04 PM   Calvin Horton [birth year] [id number]  Referring provider: Jerl Mina, MD [address] [facility] [place],  [place] [address]  Chief Complaint  Patient presents with   Follow-up    HPI: 62 year-old male who presents today for a routine annual follow up. He has a personal history of elevated PSA and BPH.   He has a personal history of elevated PSA as high as 16.4 as of 02/2010.  This remained around this range and ultimately underwent prostate biopsy on 11/17 with a PSA of 14.6 at that time.  This revealed acute and focal inflammation, otherwise no malignancy.  There was a small focus of atypical glands concerning for carcinoma but not diagnostic for this. TRUS vol at the time was 104 g.  His prostate biopsy was complicated by scrotal swelling and pain for several days.    His most recent PSA was 3.0 on 08/01/2022, likely secondary to finasteride effect.  Prostate MRI in 01/2018 showed no suspicious lesions and PI-RADS1   Follow-up MRI in 2022 visualized no focal lesion of intermediate higher suspicion for prostate cancer. It visualized BPH mild prostatomegaly and diffuse low T2 signal in the peripheral zon of the prostate gland likely postinflammatory and is considered PI-RADS 2.   Currently, he is on Sanctura and finasteride for his urinary symptoms. He reports significant improvement in his urinary symptoms, noting a decrease in frequency from every 30-40 minutes to every four to five hours, and no longer needs to get up in the middle of the night to urinate.  He also discussed his management of sugar levels with Dr. Burnett Sheng, including the recent addition of Ozempic to his regimen, which has resulted in weight loss and improved  blood sugar levels.   Results for orders placed or performed in visit on 08/09/22  BLADDER SCAN AMB NON-IMAGING  Result Value Ref Range   Scan Result 14 ml     IPSS     Row Name 08/09/22 1500         International Prostate Symptom Score   How often have you had the sensation of not emptying your bladder? Not at All     How often have you had to urinate less than every two hours? Less than 1 in 5 times     How often have you found you stopped and started again several times when you urinated? Not at All     How often have you found it difficult to postpone urination? Less than 1 in 5 times     How often have you had a weak urinary stream? Not at All     How often have you had to strain to start urination? Not at All     How many times did you typically get up at night to urinate? None     Total IPSS Score 2       Quality of Life due to urinary symptoms   If you were to spend the rest of your life with your urinary condition just the way it is now how would you feel about that? Delighted              Score:  1-7 Mild 8-19 Moderate 20-35 Severe   PMH: Past  Medical History:  Diagnosis Date   Allergy    Elevated PSA    GERD (gastroesophageal reflux disease)    Hypertension     Surgical History: Past Surgical History:  Procedure Laterality Date   CHOLECYSTECTOMY     NECK SURGERY  2016   4-5-6 vertabae on right and 4-5 on left   TONSILLECTOMY      Home Medications:  Allergies as of 08/09/2022       Reactions   Codeine    Chlorhexidine Itching, Rash        Medication List        Accurate as of Aug 09, 2022  4:04 PM. If you have any questions, ask your nurse or doctor.          Accu-Chek Aviva Plus test strip Generic drug: glucose blood TEST FASTING BLOOD SUGAR EVERY MORNING   accu-chek soft touch lancets Test fasting blood sugar every morning. DX: DM without complications-E11.9   albuterol 108 (90 Base) MCG/ACT inhaler Commonly known as:  VENTOLIN HFA Inhale 2 puffs into the lungs every 6 (six) hours as needed for wheezing or shortness of breath (Patient states he only takes if he has a URI).   atorvastatin 20 MG tablet Commonly known as: LIPITOR TAKE 1 TABLET BY MOUTH EVERY DAY   celecoxib 200 MG capsule Commonly known as: CELEBREX TAKE 1 CAPSULE BY MOUTH TWICE A DAY   cetirizine 10 MG tablet Commonly known as: ZYRTEC Take 1 tablet by mouth daily.   esomeprazole 40 MG capsule Commonly known as: NEXIUM TAKE 1 CAPSULE BY MOUTH EVERY DAY   finasteride 5 MG tablet Commonly known as: PROSCAR TAKE 1 TABLET (5 MG TOTAL) BY MOUTH DAILY.   gabapentin 600 MG tablet Commonly known as: NEURONTIN TAKE 1 TABLET BY MOUTH TWICE A DAY AS NEEDED FOR NECK PAIN.   glipiZIDE 5 MG tablet Commonly known as: GLUCOTROL TAKE 1 TABLET BY MOUTH EVERY DAY BEFORE BREAKFAST   lisinopril-hydrochlorothiazide 20-25 MG tablet Commonly known as: ZESTORETIC TAKE 1 TABLET BY MOUTH EVERY DAY   POTASSIUM CHLORIDE PO Take by mouth. Takes OTC potassium   trospium 20 MG tablet Commonly known as: SANCTURA TAKE 1 TABLET BY MOUTH TWICE A DAY        Allergies:  Allergies  Allergen Reactions   Codeine    Chlorhexidine Itching and Rash    Family History: Family History  Problem Relation Age of Onset   Diabetes Mother    Lumbar disc disease Father    Prostate cancer Father    Healthy Daughter    Healthy Son    Diabetes Maternal Grandmother    Diabetes Paternal Grandmother     Social History:  reports that he has never smoked. His smokeless tobacco use includes chew. He reports current alcohol use. He reports that he does not use drugs.   Physical Exam: BP 127/83   Pulse 94   Ht 5\' 6"  (1.676 m)   Wt 220 lb (99.8 kg)   BMI 35.51 kg/m   Constitutional:  Alert and oriented, No acute distress. HEENT: [place] AT, moist mucus membranes.  Trachea midline, no masses. GU: Prostate felt smaller, consistent with the effects of  medication. Neurologic: Grossly intact, no focal deficits, moving all 4 extremities. Psychiatric: Normal mood and affect.   Assessment & Plan:    1. BPH - PSA has decreased to 3.0, likely due to the effect of Finasteride. The improvement in urinary symptoms and PSA levels indicates effective management of BPH.  Continue current medications (Sanctura and Finasteride). No need to recheck PSA until next annual visit unless new symptoms arise.  Return in about 1 year (around 08/09/2023) for IPSS, PVR, PSA, and DRE.   [facility] [address], [address] [place], [place] [address] [phone]

## 2022-12-16 ENCOUNTER — Other Ambulatory Visit: Payer: Self-pay | Admitting: Urology

## 2023-08-02 ENCOUNTER — Other Ambulatory Visit

## 2023-08-02 DIAGNOSIS — N400 Enlarged prostate without lower urinary tract symptoms: Secondary | ICD-10-CM

## 2023-08-03 ENCOUNTER — Other Ambulatory Visit: Payer: Self-pay

## 2023-08-03 LAB — PSA: Prostate Specific Ag, Serum: 11.6 ng/mL — ABNORMAL HIGH (ref 0.0–4.0)

## 2023-08-08 ENCOUNTER — Ambulatory Visit: Payer: Self-pay | Admitting: Urology

## 2023-08-10 ENCOUNTER — Ambulatory Visit: Admitting: Urology

## 2023-08-10 VITALS — BP 134/74 | HR 93 | Ht 71.0 in | Wt 209.4 lb

## 2023-08-10 DIAGNOSIS — R972 Elevated prostate specific antigen [PSA]: Secondary | ICD-10-CM

## 2023-08-10 DIAGNOSIS — N400 Enlarged prostate without lower urinary tract symptoms: Secondary | ICD-10-CM

## 2023-08-10 LAB — BLADDER SCAN AMB NON-IMAGING: Scan Result: 22

## 2023-08-10 NOTE — Progress Notes (Signed)
 Calvin Horton,acting as a scribe for Calvin Gimenez, MD.,have documented all relevant documentation on the behalf of Calvin Gimenez, MD,as directed by  Calvin Gimenez, MD while in the presence of Calvin Gimenez, MD.  08/10/23 4:49 PM   Calvin Horton Box 07-10-1960 [id number]  Referring provider: Lyle San, MD [address] [facility] [place],  [place] [address]  Chief Complaint  Patient presents with   Benign Prostatic Hypertrophy    HPI: 63 year old male with a personal history of elevated PSA and BPH presents today for follow-up.   He has a personal history of elevated PSA as high as 16.4 as of 02/2010.  This remained around this range and ultimately underwent prostate biopsy on 11/17 with a PSA of 14.6 at that time.  This revealed acute and focal inflammation, otherwise no malignancy.  There was a small focus of atypical glands concerning for carcinoma but not diagnostic for this. TRUS vol at the time was 104 g.  His prostate biopsy was complicated by scrotal swelling and pain for several days.   Prostate MRI in 01/2018 showed no suspicious lesions and PI-RADS 1.   Follow-up MRI in 2022 visualized no focal lesion of intermediate higher suspicion for prostate cancer. It visualized BPH mild prostatomegaly and diffuse low T2 signal in the peripheral zon of the prostate gland likely postinflammatory and is considered PI-RADS 2.   He has been on Sanctura  and Finasteride  for urinary symptoms, which are well controlled.   Recently, his PSA levels have increased dramatically to 11.6, which is a significant change from previous levels.   He had COVID-19 two weeks ago, and he was treated with Zithromax . He is concerned that the recent illness and treatment may have contributed to the elevated PSA.   He reports that his urinary frequency has improved significantly with medication, now urinating every four hours compared to every thirty minutes previously. He expresses  concern about the side effects of his medications and mentions that he has been taken off glyphosate due to its effects on his blood sugar levels.   He is not experiencing any urinary symptoms despite the elevated PSA.   Results for orders placed or performed in visit on 08/10/23  Bladder Scan (Post Void Residual) in office  Result Value Ref Range   Scan Result 22 ml     IPSS     Row Name 08/10/23 1500         International Prostate Symptom Score   How often have you had the sensation of not emptying your bladder? Not at All     How often have you had to urinate less than every two hours? Not at All     How often have you found you stopped and started again several times when you urinated? Not at All     How often have you found it difficult to postpone urination? Not at All     How often have you had a weak urinary stream? Not at All     How often have you had to strain to start urination? Not at All     How many times did you typically get up at night to urinate? 1 Time     Total IPSS Score 1       Quality of Life due to urinary symptoms   If you were to spend the rest of your life with your urinary condition just the way it is now how would you feel  about that? Delighted              Score:  1-7 Mild 8-19 Moderate 20-35 Severe    PMH: Past Medical History:  Diagnosis Date   Allergy    Elevated PSA    GERD (gastroesophageal reflux disease)    Hypertension     Surgical History: Past Surgical History:  Procedure Laterality Date   CHOLECYSTECTOMY     NECK SURGERY  2016   4-5-6 vertabae on right and 4-5 on left   TONSILLECTOMY      Home Medications:  Allergies as of 08/10/2023       Reactions   Codeine    Chlorhexidine Itching, Rash        Medication List        Accurate as of Aug 10, 2023  4:49 PM. If you have any questions, ask your nurse or doctor.          STOP taking these medications    Accu-Chek Aviva Plus test strip Generic drug:  glucose blood   glipiZIDE  5 MG tablet Commonly known as: GLUCOTROL        TAKE these medications    accu-chek soft touch lancets Test fasting blood sugar every morning. DX: DM without complications-E11.9   albuterol  108 (90 Base) MCG/ACT inhaler Commonly known as: VENTOLIN  HFA Inhale 2 puffs into the lungs every 6 (six) hours as needed for wheezing or shortness of breath (Patient states he only takes if he has a URI).   atorvastatin  20 MG tablet Commonly known as: LIPITOR TAKE 1 TABLET BY MOUTH EVERY DAY   celecoxib  200 MG capsule Commonly known as: CELEBREX  TAKE 1 CAPSULE BY MOUTH TWICE A DAY   cetirizine 10 MG tablet Commonly known as: ZYRTEC Take 1 tablet by mouth daily.   esomeprazole  40 MG capsule Commonly known as: NEXIUM  TAKE 1 CAPSULE BY MOUTH EVERY DAY   finasteride  5 MG tablet Commonly known as: PROSCAR  TAKE 1 TABLET (5 MG TOTAL) BY MOUTH DAILY.   gabapentin  600 MG tablet Commonly known as: NEURONTIN  TAKE 1 TABLET BY MOUTH TWICE A DAY AS NEEDED FOR NECK PAIN.   lisinopril -hydrochlorothiazide  20-25 MG tablet Commonly known as: ZESTORETIC  TAKE 1 TABLET BY MOUTH EVERY DAY   POTASSIUM CHLORIDE PO Take by mouth. Takes OTC potassium   trospium  20 MG tablet Commonly known as: SANCTURA  TAKE 1 TABLET BY MOUTH TWICE A DAY        Allergies:  Allergies  Allergen Reactions   Codeine    Chlorhexidine Itching and Rash    Family History: Family History  Problem Relation Age of Onset   Diabetes Mother    Lumbar disc disease Father    Prostate cancer Father    Healthy Daughter    Healthy Son    Diabetes Maternal Grandmother    Diabetes Paternal Grandmother     Social History:  reports that he has never smoked. His smokeless tobacco use includes chew. He reports current alcohol use. He reports that he does not use drugs.   Physical Exam: BP 134/74   Pulse 93   Ht 5\' 11"  (1.803 m)   Wt 209 lb 6 oz (95 kg)   BMI 29.20 kg/m   Constitutional:   Alert and oriented, No acute distress. HEENT: Calvin Horton AT, moist mucus membranes.  Trachea midline, no masses. GU: Large prostate with no nodules Neurologic: Grossly intact, no focal deficits, moving all 4 extremities. Psychiatric: Normal mood and affect.   Assessment & Plan:  1. Elevated PSA - His PSA has increased significantly to 11.6, which is a dramatic change from previous levels.  - Given the recent history of COVID-19 infection and antibiotic use, there is a possibility of inflammation affecting the PSA levels. - The plan is to retest the PSA in six weeks to determine if the elevation persists.  - He will be set up for a PSA test in [place].  -No immediate changes to the current management plan until further results are available.  2. BPH with urinary frequency - His urinary symptoms are well controlled on Sanctura  and Finasteride . He reports urinating every four hours, which is a significant improvement from every thirty minutes.  - The current medication regimen will be continued as it is effective, and he is satisfied with the symptom control.   Return in about 6 weeks (around 09/21/2023) for repeat PSA.  I have reviewed the above documentation for accuracy and completeness, and I agree with the above.   Calvin Gimenez, MD   I spent 35 total minutes on the day of the encounter including pre-visit review of the medical record, face-to-face time with the patient, and post visit ordering of labs/imaging/tests.  [facility] [address], [address] [place], [place] [address] [phone]

## 2023-09-02 IMAGING — MR MR PROSTATE WO/W CM
56 series · 56 of 56 positions shown · IV contrast (10ml Gadavist)
Comparison: 01/01/2018 prostate MRI

CLINICAL DATA: Prostatomegaly and elevated PSA level

EXAM:
MR PROSTATE WITHOUT AND WITH CONTRAST
TECHNIQUE: Multiplanar multisequence MRI images were obtained of the pelvis
centered about the prostate. Pre and post contrast images were
obtained.
CONTRAST:  10mL GADAVIST GADOBUTROL 1 MMOL/ML IV SOLN

[Series 3: ax in&out whole · axial · 3.0mm · 1.19mm/px · 1 of 88 slices shown (1 of 2)]
[im 1/88]
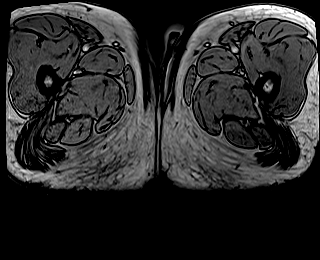

[Series 4: ax in&out whole · axial · 3.0mm · 1.19mm/px · 1 of 88 slices shown (2 of 2)]
[im 1/88]
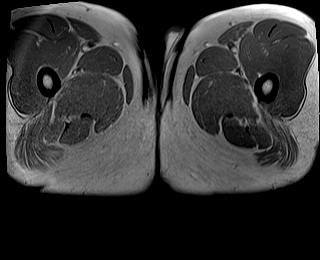

[Series 5: T2 · coronal · 3.0mm · 0.70mm/px · 1 of 35 slices shown (1 of 3)]
[im 1/35]
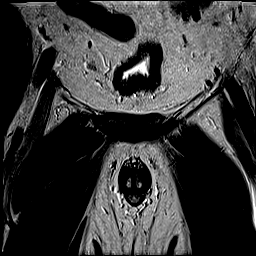

[Series 6: T2 · axial · 3.0mm · 0.56mm/px · 1 of 25 slices shown (2 of 3)]
[im 1/25]
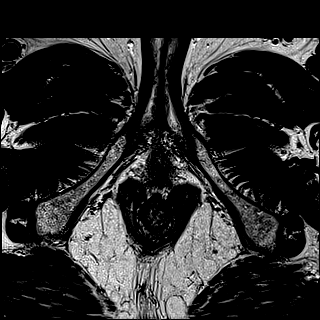

[Series 7: DWI · axial · 3.0mm · 0.86mm/px · 1 of 75 slices shown (1 of 3)]
[im 1/75]
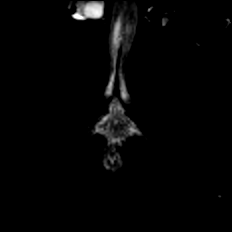

[Series 8: DWI · axial · 3.0mm · 0.86mm/px · 1 of 25 slices shown (2 of 3)]
[im 1/25]
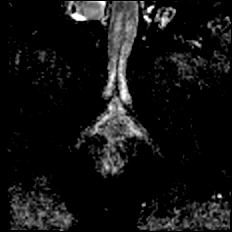

[Series 9: DWI · axial · 3.0mm · 0.86mm/px · 1 of 25 slices shown (3 of 3)]
[im 1/25]
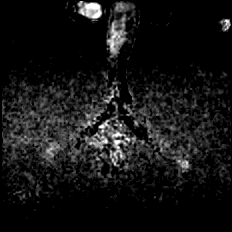

[Series 10: T2 · axial · 1.0mm · 1.04mm/px · 1 of 72 slices shown (3 of 3)]
[im 1/72]
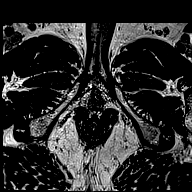

[Series 11: T1 · axial · 3.0mm · 1.15mm/px · 1 of 28 slices shown (1 of 48)]
[im 1/28]
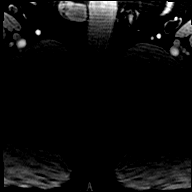

[Series 12: T1 · axial · 3.0mm · 1.15mm/px · 1 of 28 slices shown (2 of 48)]
[im 1/28]
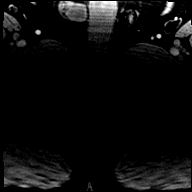

[Series 13: T1 · axial · 3.0mm · 1.15mm/px · 1 of 28 slices shown (3 of 48)]
[im 1/28]
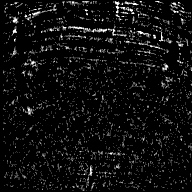

[Series 14: T1 · axial · 3.0mm · 1.15mm/px · 1 of 28 slices shown (4 of 48)]
[im 1/28]
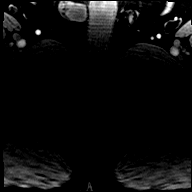

[Series 15: T1 · axial · 3.0mm · 1.15mm/px · 1 of 28 slices shown (5 of 48)]
[im 1/28]
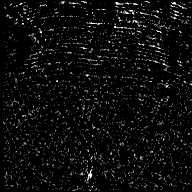

[Series 16: T1 · axial · 3.0mm · 1.15mm/px · 1 of 28 slices shown (6 of 48)]
[im 1/28]
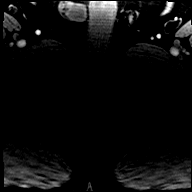

[Series 17: T1 · axial · 3.0mm · 1.15mm/px · 1 of 28 slices shown (7 of 48)]
[im 1/28]
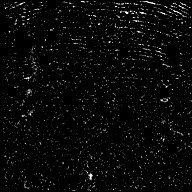

[Series 18: T1 · axial · 3.0mm · 1.15mm/px · 1 of 28 slices shown (8 of 48)]
[im 1/28]
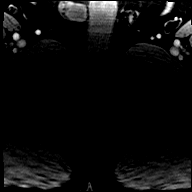

[Series 19: T1 · axial · 3.0mm · 1.15mm/px · 1 of 28 slices shown (9 of 48)]
[im 1/28]
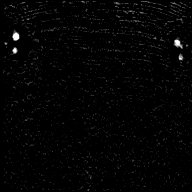

[Series 20: T1 · axial · 3.0mm · 1.15mm/px · 1 of 28 slices shown (10 of 48)]
[im 1/28]
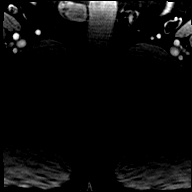

[Series 21: T1 · axial · 3.0mm · 1.15mm/px · 1 of 28 slices shown (11 of 48)]
[im 1/28]
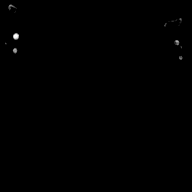

[Series 22: T1 · axial · 3.0mm · 1.15mm/px · 1 of 28 slices shown (12 of 48)]
[im 1/28]
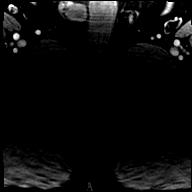

[Series 23: T1 · axial · 3.0mm · 1.15mm/px · 1 of 28 slices shown (13 of 48)]
[im 1/28]
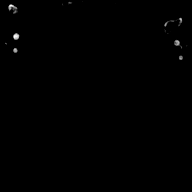

[Series 24: T1 · axial · 3.0mm · 1.15mm/px · 1 of 28 slices shown (14 of 48)]
[im 1/28]
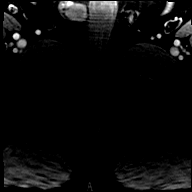

[Series 25: T1 · axial · 3.0mm · 1.15mm/px · 1 of 28 slices shown (15 of 48)]
[im 1/28]
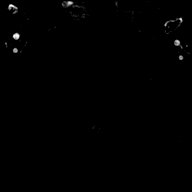

[Series 26: T1 · axial · 3.0mm · 1.15mm/px · 1 of 28 slices shown (16 of 48)]
[im 1/28]
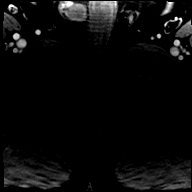

[Series 27: T1 · axial · 3.0mm · 1.15mm/px · 1 of 28 slices shown (17 of 48)]
[im 1/28]
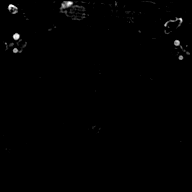

[Series 28: T1 · axial · 3.0mm · 1.15mm/px · 1 of 28 slices shown (18 of 48)]
[im 1/28]
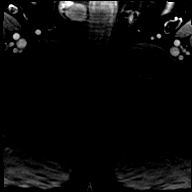

[Series 29: T1 · axial · 3.0mm · 1.15mm/px · 1 of 28 slices shown (19 of 48)]
[im 1/28]
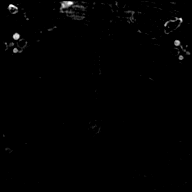

[Series 30: T1 · axial · 3.0mm · 1.15mm/px · 1 of 28 slices shown (20 of 48)]
[im 1/28]
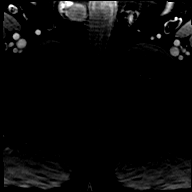

[Series 31: T1 · axial · 3.0mm · 1.15mm/px · 1 of 28 slices shown (21 of 48)]
[im 1/28]
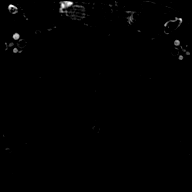

[Series 32: T1 · axial · 3.0mm · 1.15mm/px · 1 of 28 slices shown (22 of 48)]
[im 1/28]
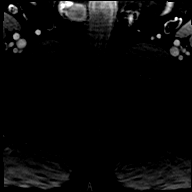

[Series 33: T1 · axial · 3.0mm · 1.15mm/px · 1 of 28 slices shown (23 of 48)]
[im 1/28]
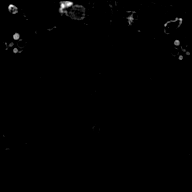

[Series 34: T1 · axial · 3.0mm · 1.15mm/px · 1 of 28 slices shown (24 of 48)]
[im 1/28]
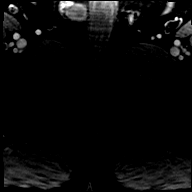

[Series 35: T1 · axial · 3.0mm · 1.15mm/px · 1 of 28 slices shown (25 of 48)]
[im 1/28]
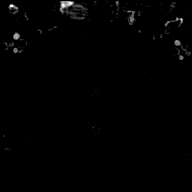

[Series 36: T1 · axial · 3.0mm · 1.15mm/px · 1 of 28 slices shown (26 of 48)]
[im 1/28]
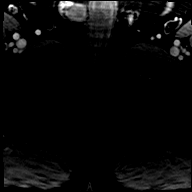

[Series 37: T1 · axial · 3.0mm · 1.15mm/px · 1 of 28 slices shown (27 of 48)]
[im 1/28]
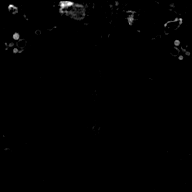

[Series 38: T1 · axial · 3.0mm · 1.15mm/px · 1 of 28 slices shown (28 of 48)]
[im 1/28]
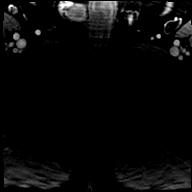

[Series 39: T1 · axial · 3.0mm · 1.15mm/px · 1 of 28 slices shown (29 of 48)]
[im 1/28]
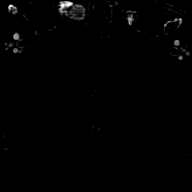

[Series 40: T1 · axial · 3.0mm · 1.15mm/px · 1 of 28 slices shown (30 of 48)]
[im 1/28]
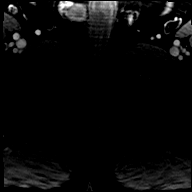

[Series 41: T1 · axial · 3.0mm · 1.15mm/px · 1 of 28 slices shown (31 of 48)]
[im 1/28]
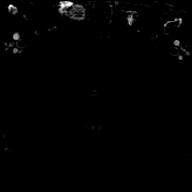

[Series 42: T1 · axial · 3.0mm · 1.15mm/px · 1 of 28 slices shown (32 of 48)]
[im 1/28]
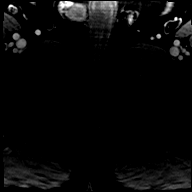

[Series 43: T1 · axial · 3.0mm · 1.15mm/px · 1 of 28 slices shown (33 of 48)]
[im 1/28]
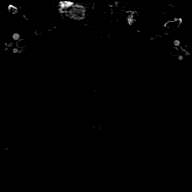

[Series 44: T1 · axial · 3.0mm · 1.15mm/px · 1 of 28 slices shown (34 of 48)]
[im 1/28]
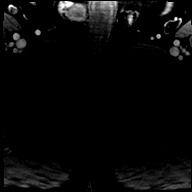

[Series 45: T1 · axial · 3.0mm · 1.15mm/px · 1 of 28 slices shown (35 of 48)]
[im 1/28]
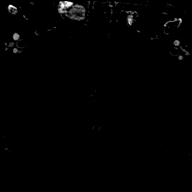

[Series 46: T1 · axial · 3.0mm · 1.15mm/px · 1 of 28 slices shown (36 of 48)]
[im 1/28]
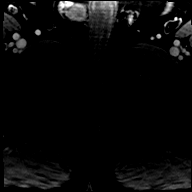

[Series 47: T1 · axial · 3.0mm · 1.15mm/px · 1 of 28 slices shown (37 of 48)]
[im 1/28]
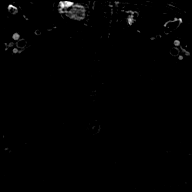

[Series 48: T1 · axial · 3.0mm · 1.15mm/px · 1 of 28 slices shown (38 of 48)]
[im 1/28]
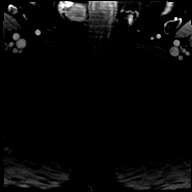

[Series 49: T1 · axial · 3.0mm · 1.15mm/px · 1 of 28 slices shown (39 of 48)]
[im 1/28]
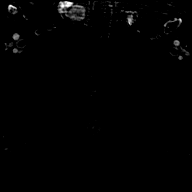

[Series 50: T1 · axial · 3.0mm · 1.15mm/px · 1 of 28 slices shown (40 of 48)]
[im 1/28]
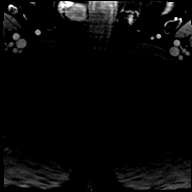

[Series 51: T1 · axial · 3.0mm · 1.15mm/px · 1 of 28 slices shown (41 of 48)]
[im 1/28]
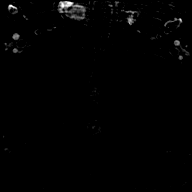

[Series 52: T1 · axial · 3.0mm · 1.15mm/px · 1 of 28 slices shown (42 of 48)]
[im 1/28]
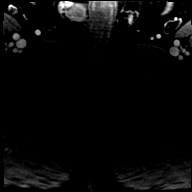

[Series 53: T1 · axial · 3.0mm · 1.15mm/px · 1 of 28 slices shown (43 of 48)]
[im 1/28]
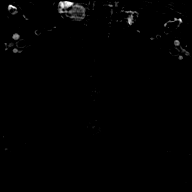

[Series 54: T1 · axial · 3.0mm · 1.15mm/px · 1 of 28 slices shown (44 of 48)]
[im 1/28]
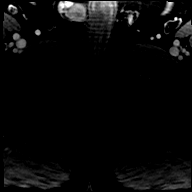

[Series 55: T1 · axial · 3.0mm · 1.15mm/px · 1 of 28 slices shown (45 of 48)]
[im 1/28]
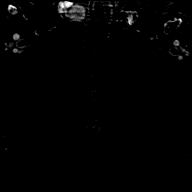

[Series 56: T1 · axial · 3.0mm · 1.15mm/px · 1 of 28 slices shown (46 of 48)]
[im 1/28]
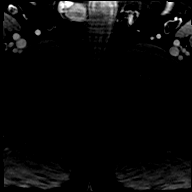

[Series 57: T1 · axial · 3.0mm · 1.15mm/px · 1 of 28 slices shown (47 of 48)]
[im 1/28]
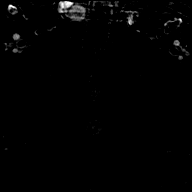

[Series 58: T1 · axial · 3.0mm · 1.15mm/px · 1 of 28 slices shown (48 of 48)]
[im 1/28]
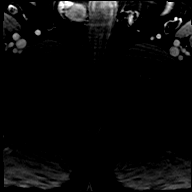

[56 of 56 positions shown; findings below may reference images not displayed]

FINDINGS: Prostate: Encapsulated nodularity in the transition zone is
compatible with benign prostatic hypertrophy.

Diffuse moderate low T2 signal throughout the peripheral zone
without focal lesion identified. This corresponds to diffuse
enhancement in the peripheral zone, again nonfocal. This most likely
reflects PI-RADS category 2 postinflammatory signal.

No focal lesion of intermediate or higher suspicion for prostate
cancer is identified.

Volume: 3D volumetric analysis: Prostate volume 66.16 cc (5.4 by
by 5.3 cm).

Transcapsular spread:  Absent

Seminal vesicle involvement: Absent

Neurovascular bundle involvement: Absent

Pelvic adenopathy: Absent

Bone metastasis: Absent

Other findings: Scattered sigmoid colon diverticula.
IMPRESSION: 1. No focal lesion of intermediate or higher suspicion for prostate
cancer is identified.
2. Benign prostatic hypertrophy mild prostatomegaly.
3. Diffuse low T2 signal in the peripheral zone of the prostate
gland is likely postinflammatory and is considered PI-RADS category
2.
4. Scattered diverticula of the sigmoid colon.

## 2023-09-28 ENCOUNTER — Other Ambulatory Visit

## 2024-07-31 ENCOUNTER — Other Ambulatory Visit

## 2024-08-07 ENCOUNTER — Ambulatory Visit: Admitting: Urology
